# Patient Record
Sex: Male | Born: 1995
Health system: Southern US, Community
[De-identification: ages and names within clinical notes are randomized; demographics above are authoritative.]

## PROBLEM LIST (undated history)

## (undated) DIAGNOSIS — R51 Headache: Secondary | ICD-10-CM

## (undated) DIAGNOSIS — R519 Headache, unspecified: Secondary | ICD-10-CM

## (undated) HISTORY — PX: CIRCUMCISION: SUR203

## (undated) HISTORY — DX: Headache, unspecified: R51.9

## (undated) HISTORY — DX: Headache: R51

## (undated) HISTORY — PX: MYRINGOTOMY WITH TUBE PLACEMENT: SHX5663

---
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
## 2018-05-09 ENCOUNTER — Encounter: Payer: Self-pay | Admitting: Family Medicine

## 2018-05-09 ENCOUNTER — Ambulatory Visit (INDEPENDENT_AMBULATORY_CARE_PROVIDER_SITE_OTHER): Payer: No Typology Code available for payment source | Admitting: Family Medicine

## 2018-05-09 ENCOUNTER — Other Ambulatory Visit: Payer: Self-pay

## 2018-05-09 VITALS — BP 115/62 | HR 68 | Temp 98.4°F | Ht 69.6 in | Wt 211.3 lb

## 2018-05-09 DIAGNOSIS — Z7689 Persons encountering health services in other specified circumstances: Secondary | ICD-10-CM

## 2018-05-09 DIAGNOSIS — G43009 Migraine without aura, not intractable, without status migrainosus: Secondary | ICD-10-CM

## 2018-05-09 DIAGNOSIS — R062 Wheezing: Secondary | ICD-10-CM

## 2018-05-09 MED ORDER — ALBUTEROL SULFATE HFA 108 (90 BASE) MCG/ACT IN AERS: 2.0000 | INHALATION_SPRAY | Freq: Four times a day (QID) | RESPIRATORY_TRACT | 0 refills | Status: AC | PRN

## 2018-05-09 MED ORDER — SUMATRIPTAN SUCCINATE 50 MG PO TABS: 50.0000 mg | ORAL_TABLET | Freq: Once | ORAL | 4 refills | Status: DC

## 2018-05-09 MED ORDER — PROPRANOLOL HCL 10 MG PO TABS: 10.0000 mg | ORAL_TABLET | Freq: Two times a day (BID) | ORAL | 1 refills | Status: DC

## 2018-05-09 NOTE — Progress Notes (Signed)
BP 115/62   Pulse 68   Temp 98.4 F (36.9 C) (Oral)   Ht 5' 9.6" (1.768 m)   Wt 211 lb 4.8 oz (95.8 kg)   SpO2 99%   BMI 30.67 kg/m    Subjective:    Patient ID: Austin Miles, male    DOB: 07-04-1996, 22 y.o.   MRN: [id number]  HPI: Austin Miles is a 22 y.o. male  Chief Complaint  Patient presents with  . New Patient (Initial Visit)    pt would like to disduss about headaches   Here today to establish care. Hx of migraines, now having headaches every day. Used to be on propranolol which helped quite a bit. Currently taking OTC pain relievers with minimal relief. Photophobia, N/V sometimes with more severe ones. Denies confusion, extremity numbness or weakness, visual changes.   Also has an albuterol inhaler on hand for prn chest tightness, usually occurring with exertion.   No other concerns today. Has not had a CPE in several years.   Relevant past medical, surgical, family and social history reviewed and updated as indicated. Interim medical history since our last visit reviewed. Allergies and medications reviewed and updated.  Review of Systems  Per HPI unless specifically indicated above     Objective:    BP 115/62   Pulse 68   Temp 98.4 F (36.9 C) (Oral)   Ht 5' 9.6" (1.768 m)   Wt 211 lb 4.8 oz (95.8 kg)   SpO2 99%   BMI 30.67 kg/m   Wt Readings from Last 3 Encounters:  05/09/18 211 lb 4.8 oz (95.8 kg) (95 %, Z= 1.67)*  06/25/14 209 lb 6.4 oz (95 kg) (99 %, Z= 2.19)*  06/24/14 208 lb 12.8 oz (94.7 kg) (99 %, Z= 2.17)*   * Growth percentiles are based on CDC (Boys, 2-20 Years) data.    Physical Exam  Constitutional: He is oriented to person, place, and time. He appears well-developed and well-nourished. No distress.  HENT:  Head: Atraumatic.  Eyes: Conjunctivae and EOM are normal.  Neck: Normal range of motion. Neck supple.  Cardiovascular: Normal rate and regular rhythm.  Pulmonary/Chest: Effort normal and breath sounds normal.    Musculoskeletal: Normal range of motion.  Neurological: He is alert and oriented to person, place, and time. No cranial nerve deficit or sensory deficit.  Skin: Skin is warm and dry.  Psychiatric: He has a normal mood and affect. His behavior is normal.  Nursing note and vitals reviewed.   Results for orders placed or performed during the hospital encounter of 06/24/14  CBC with Differential  Result Value Ref Range   WBC 8.6 4.5 - 13.5 K/uL   RBC 5.12 3.80 - 5.20 MIL/uL   Hemoglobin 15.9 (H) 11.0 - 14.6 g/dL   HCT 08.6 (H) 57.8 - 46.9 %   MCV 87.9 77.0 - 95.0 fL   MCH 31.1 25.0 - 33.0 pg   MCHC 35.3 31.0 - 37.0 g/dL   RDW 62.9 52.8 - 41.3 %   Platelets 173 150 - 400 K/uL   Neutrophils Relative % 69 (H) 33 - 67 %   Neutro Abs 6.0 1.5 - 8.0 K/uL   Lymphocytes Relative 23 (L) 31 - 63 %   Lymphs Abs 2.0 1.5 - 7.5 K/uL   Monocytes Relative 7 3 - 11 %   Monocytes Absolute 0.6 0.2 - 1.2 K/uL   Eosinophils Relative 1 0 - 5 %   Eosinophils Absolute 0.1 0.0 -  1.2 K/uL   Basophils Relative 0 0 - 1 %   Basophils Absolute 0.0 0.0 - 0.1 K/uL  Comprehensive metabolic panel  Result Value Ref Range   Sodium 138 137 - 147 mEq/L   Potassium 3.8 3.7 - 5.3 mEq/L   Chloride 99 96 - 112 mEq/L   CO2 25 19 - 32 mEq/L   Glucose, Bld 127 (H) 70 - 99 mg/dL   BUN 12 6 - 23 mg/dL   Creatinine, Ser 5.11 (H) 0.50 - 1.00 mg/dL   Calcium 9.6 8.4 - 02.1 mg/dL   Total Protein 7.4 6.0 - 8.3 g/dL   Albumin 4.2 3.5 - 5.2 g/dL   AST 16 0 - 37 U/L   ALT 16 0 - 53 U/L   Alkaline Phosphatase 142 74 - 390 U/L   Total Bilirubin 0.4 0.3 - 1.2 mg/dL   GFR calc non Af Amer NOT CALCULATED >90 mL/min   GFR calc Af Amer NOT CALCULATED >90 mL/min   Anion gap 14 5 - 15  Lipase, blood  Result Value Ref Range   Lipase 19 11 - 59 U/L  Amylase  Result Value Ref Range   Amylase 55 0 - 105 U/L  Urinalysis, Routine w reflex microscopic  Result Value Ref Range   Color, Urine YELLOW YELLOW   APPearance CLEAR CLEAR    Specific Gravity, Urine 1.030 1.005 - 1.030   pH 7.0 5.0 - 8.0   Glucose, UA NEGATIVE NEGATIVE mg/dL   Hgb urine dipstick NEGATIVE NEGATIVE   Bilirubin Urine NEGATIVE NEGATIVE   Ketones, ur NEGATIVE NEGATIVE mg/dL   Protein, ur NEGATIVE NEGATIVE mg/dL   Urobilinogen, UA 1.0 0.0 - 1.0 mg/dL   Nitrite NEGATIVE NEGATIVE   Leukocytes, UA NEGATIVE NEGATIVE      Assessment & Plan:   Problem List Items Addressed This Visit      Cardiovascular and Mediastinum   Migraine without aura    Restart propranolol, watch BPs and HR. Imitrex for prn use. Continue OTC pain relievers prn      Relevant Medications   propranolol (INDERAL) 10 MG tablet    Other Visit Diagnoses    Encounter to establish care    -  Primary   Wheezing       Occasional, usually with illness or exercise. No formal dx of asthma. Will do spirometry if becoming more frequent than 1 inhaler per year       Follow up plan: Return in about 4 weeks (around 06/06/2018) for CPE.

## 2018-05-20 NOTE — Patient Instructions (Signed)
Follow up for CPE 

## 2018-05-20 NOTE — Assessment & Plan Note (Signed)
Restart propranolol, watch BPs and HR. Imitrex for prn use. Continue OTC pain relievers prn

## 2018-06-08 ENCOUNTER — Encounter: Payer: Self-pay | Admitting: Family Medicine

## 2018-06-08 ENCOUNTER — Ambulatory Visit: Payer: No Typology Code available for payment source | Admitting: Family Medicine

## 2018-06-08 VITALS — BP 134/72 | HR 93 | Temp 97.9°F | Ht 69.2 in | Wt 217.9 lb

## 2018-06-08 DIAGNOSIS — G43009 Migraine without aura, not intractable, without status migrainosus: Secondary | ICD-10-CM

## 2018-06-08 DIAGNOSIS — Z0000 Encounter for general adult medical examination without abnormal findings: Secondary | ICD-10-CM | POA: Diagnosis not present

## 2018-06-08 LAB — UA/M W/RFLX CULTURE, ROUTINE
Bilirubin, UA: NEGATIVE
Glucose, UA: NEGATIVE
Ketones, UA: NEGATIVE
Leukocytes, UA: NEGATIVE
NITRITE UA: NEGATIVE
Protein, UA: NEGATIVE
RBC, UA: NEGATIVE
Specific Gravity, UA: 1.02 (ref 1.005–1.030)
Urobilinogen, Ur: 0.2 mg/dL (ref 0.2–1.0)
pH, UA: 6 (ref 5.0–7.5)

## 2018-06-08 MED ORDER — PROPRANOLOL HCL ER 60 MG PO CP24: 60.0000 mg | ORAL_CAPSULE | Freq: Every day | ORAL | 1 refills | Status: DC

## 2018-06-08 NOTE — Progress Notes (Signed)
BP 134/72   Pulse 93   Temp 97.9 F (36.6 C) (Oral)   Ht 5' 9.2" (1.758 m)   Wt 217 lb 14.4 oz (98.8 kg)   SpO2 98%   BMI 31.99 kg/m    Subjective:    Patient ID: Austin Miles, male    DOB: 1996-04-11, 22 y.o.   MRN: [id number]  HPI: [name] is a 22 y.o. male presenting on 06/08/2018 for comprehensive medical examination. Current medical complaints include:see below  Headaches significantly improved with re addition of propranolol. Does not take very consistently because of the twice daily dosing, typically forgets night dose. Only reports 1-2 migraines the past month for which imitrex worked well. Denies nausea, vomiting, visual changes, dizziness.   Depression Screen done today and results listed below:  Depression screen [facility] 2/9 06/08/2018 05/09/2018  Decreased Interest 0 0  Down, Depressed, Hopeless 0 0  PHQ - 2 Score 0 0  Altered sleeping 0 0  Tired, decreased energy 0 0  Change in appetite 0 0  Feeling bad or failure about yourself  0 0  Trouble concentrating 0 0  Moving slowly or fidgety/restless 0 0  Suicidal thoughts 0 0  PHQ-9 Score 0 0  Difficult doing work/chores - Not difficult at all    The patient does not have a history of falls. I did not complete a risk assessment for falls. A plan of care for falls was not documented.   Past Medical History:  Past Medical History:  Diagnosis Date  . Headache     Surgical History:  Past Surgical History:  Procedure Laterality Date  . CIRCUMCISION  1999  . MYRINGOTOMY WITH TUBE PLACEMENT Bilateral    39 Months of age    Medications:  Current Outpatient Medications on File Prior to Visit  Medication Sig  . albuterol (PROVENTIL HFA;VENTOLIN HFA) 108 (90 Base) MCG/ACT inhaler Inhale 2 puffs into the lungs every 6 (six) hours as needed for wheezing or shortness of breath.  . SUMAtriptan (IMITREX) 50 MG tablet TAKE 1 TABLET BY MOUTH ONCE FOR 1 DOSE. MAY REPEAT IN 2 HOURS IF HEADACHE PERSISTS OR  RECURS.   No current facility-administered medications on file prior to visit.     Allergies:  No Known Allergies  Social History:  Social History   Socioeconomic History  . Marital status: Single    Spouse name: Not on file  . Number of children: Not on file  . Years of education: Not on file  . Highest education level: Not on file  Occupational History  . Not on file  Social Needs  . Financial resource strain: Not on file  . Food insecurity:    Worry: Not on file    Inability: Not on file  . Transportation needs:    Medical: Not on file    Non-medical: Not on file  Tobacco Use  . Smoking status: Never Smoker  . Smokeless tobacco: Former Engineer, water and Sexual Activity  . Alcohol use: Yes    Comment: just some times per pt  . Drug use: No  . Sexual activity: Yes  Lifestyle  . Physical activity:    Days per week: Not on file    Minutes per session: Not on file  . Stress: Not on file  Relationships  . Social connections:    Talks on phone: Not on file    Gets together: Not on file    Attends religious service: Not on file  Active member of club or organization: Not on file    Attends meetings of clubs or organizations: Not on file    Relationship status: Not on file  . Intimate partner violence:    Fear of current or ex partner: Not on file    Emotionally abused: Not on file    Physically abused: Not on file    Forced sexual activity: Not on file  Other Topics Concern  . Not on file  Social History Narrative  . Not on file   Social History   Tobacco Use  Smoking Status Never Smoker  Smokeless Tobacco Former Neurosurgeon   Social History   Substance and Sexual Activity  Alcohol Use Yes   Comment: just some times per pt    Family History:  Family History  Problem Relation Age of Onset  . Multiple sclerosis Mother     Past medical history, surgical history, medications, allergies, family history and social history reviewed with patient today and  changes made to appropriate areas of the chart.   Review of Systems - General ROS: negative Psychological ROS: negative Ophthalmic ROS: negative ENT ROS: negative Allergy and Immunology ROS: negative Hematological and Lymphatic ROS: negative Endocrine ROS: negative Respiratory ROS: no cough, shortness of breath, or wheezing Cardiovascular ROS: no chest pain or dyspnea on exertion Gastrointestinal ROS: no abdominal pain, change in bowel habits, or black or bloody stools Genito-Urinary ROS: no dysuria, trouble voiding, or hematuria Musculoskeletal ROS: negative Neurological ROS: no TIA or stroke symptoms Dermatological ROS: negative All other ROS negative except what is listed above and in the HPI.      Objective:    BP 134/72   Pulse 93   Temp 97.9 F (36.6 C) (Oral)   Ht 5' 9.2" (1.758 m)   Wt 217 lb 14.4 oz (98.8 kg)   SpO2 98%   BMI 31.99 kg/m   Wt Readings from Last 3 Encounters:  06/08/18 217 lb 14.4 oz (98.8 kg) (96 %, Z= 1.81)*  05/09/18 211 lb 4.8 oz (95.8 kg) (95 %, Z= 1.67)*  06/25/14 209 lb 6.4 oz (95 kg) (99 %, Z= 2.19)*   * Growth percentiles are based on CDC (Boys, 2-20 Years) data.    Physical Exam  Constitutional: He is oriented to person, place, and time. He appears well-developed and well-nourished. No distress.  HENT:  Head: Atraumatic.  Right Ear: External ear normal.  Left Ear: External ear normal.  Nose: Nose normal.  Mouth/Throat: Oropharynx is clear and moist.  Eyes: Pupils are equal, round, and reactive to light. Conjunctivae and EOM are normal. No scleral icterus.  Neck: Normal range of motion. Neck supple.  Cardiovascular: Normal rate, regular rhythm, normal heart sounds and intact distal pulses.  No murmur heard. Pulmonary/Chest: Effort normal and breath sounds normal. No respiratory distress.  Abdominal: Soft. Bowel sounds are normal. He exhibits no distension and no mass. There is no tenderness. There is no guarding.  Musculoskeletal:  Normal range of motion. He exhibits no edema or tenderness.  Neurological: He is alert and oriented to person, place, and time. He has normal reflexes. No cranial nerve deficit.  Skin: Skin is warm and dry. No rash noted.  Psychiatric: He has a normal mood and affect. His behavior is normal.  Nursing note and vitals reviewed.   Results for orders placed or performed in visit on 06/08/18  CBC with Differential/Platelet  Result Value Ref Range   WBC 5.7 3.4 - 10.8 x10E3/uL   RBC 5.16  4.14 - 5.80 x10E6/uL   Hemoglobin 15.5 13.0 - 17.7 g/dL   Hematocrit 16.1 09.6 - 51.0 %   MCV 87 79 - 97 fL   MCH 30.0 26.6 - 33.0 pg   MCHC 34.4 31.5 - 35.7 g/dL   RDW 04.5 40.9 - 81.1 %   Platelets 185 150 - 450 x10E3/uL   Neutrophils 55 Not Estab. %   Lymphs 34 Not Estab. %   Monocytes 8 Not Estab. %   Eos 3 Not Estab. %   Basos 0 Not Estab. %   Neutrophils Absolute 3.1 1.4 - 7.0 x10E3/uL   Lymphocytes Absolute 1.9 0.7 - 3.1 x10E3/uL   Monocytes Absolute 0.5 0.1 - 0.9 x10E3/uL   EOS (ABSOLUTE) 0.2 0.0 - 0.4 x10E3/uL   Basophils Absolute 0.0 0.0 - 0.2 x10E3/uL   Immature Granulocytes 0 Not Estab. %   Immature Grans (Abs) 0.0 0.0 - 0.1 x10E3/uL  Comprehensive metabolic panel  Result Value Ref Range   Glucose 59 (L) 65 - 99 mg/dL   BUN 20 6 - 20 mg/dL   Creatinine, Ser 9.14 0.76 - 1.27 mg/dL   GFR calc non Af Amer 105 >59 mL/min/1.73   GFR calc Af Amer 121 >59 mL/min/1.73   BUN/Creatinine Ratio 19 9 - 20   Sodium 141 134 - 144 mmol/L   Potassium 3.8 3.5 - 5.2 mmol/L   Chloride 98 96 - 106 mmol/L   CO2 21 20 - 29 mmol/L   Calcium 9.5 8.7 - 10.2 mg/dL   Total Protein 7.0 6.0 - 8.5 g/dL   Albumin 4.8 3.5 - 5.5 g/dL   Globulin, Total 2.2 1.5 - 4.5 g/dL   Albumin/Globulin Ratio 2.2 1.2 - 2.2   Bilirubin Total 0.4 0.0 - 1.2 mg/dL   Alkaline Phosphatase 79 39 - 117 IU/L   AST 17 0 - 40 IU/L   ALT 27 0 - 44 IU/L  Lipid Panel w/o Chol/HDL Ratio  Result Value Ref Range   Cholesterol, Total 162  100 - 169 mg/dL   Triglycerides 782 (H) 0 - 89 mg/dL   HDL 39 (L) >95 mg/dL   VLDL Cholesterol Cal 56 (H) 5 - 40 mg/dL   LDL Calculated 67 0 - 109 mg/dL  UA/M w/rflx Culture, Routine  Result Value Ref Range   Specific Gravity, UA 1.020 1.005 - 1.030   pH, UA 6.0 5.0 - 7.5   Color, UA Yellow Yellow   Appearance Ur Clear Clear   Leukocytes, UA Negative Negative   Protein, UA Negative Negative/Trace   Glucose, UA Negative Negative   Ketones, UA Negative Negative   RBC, UA Negative Negative   Bilirubin, UA Negative Negative   Urobilinogen, Ur 0.2 0.2 - 1.0 mg/dL   Nitrite, UA Negative Negative      Assessment & Plan:   Problem List Items Addressed This Visit      Cardiovascular and Mediastinum   Migraine without aura - Primary    Improved significantly with propranolol added back in. Will switch to ER capsule to improve compliance. Imitrex prn for treatment      Relevant Medications   SUMAtriptan (IMITREX) 50 MG tablet   propranolol ER (INDERAL LA) 60 MG 24 hr capsule    Other Visit Diagnoses    Annual physical exam       Relevant Orders   CBC with Differential/Platelet (Completed)   Comprehensive metabolic panel (Completed)   Lipid Panel w/o Chol/HDL Ratio (Completed)   UA/M w/rflx Culture, Routine (Completed)  Discussed aspirin prophylaxis for myocardial infarction prevention and decision was it was not indicated  LABORATORY TESTING:  Health maintenance labs ordered today as discussed above.   IMMUNIZATIONS:   - Tdap: Tetanus vaccination status reviewed: last tetanus booster within 10 years. - Influenza: Refused  PATIENT COUNSELING:    Sexuality: Discussed sexually transmitted diseases, partner selection, use of condoms, avoidance of unintended pregnancy  and contraceptive alternatives.   Advised to avoid cigarette smoking.  I discussed with the patient that most people either abstain from alcohol or drink within safe limits (<=14/week and <=4  drinks/occasion for males, <=7/weeks and <= 3 drinks/occasion for females) and that the risk for alcohol disorders and other health effects rises proportionally with the number of drinks per week and how often a drinker exceeds daily limits.  Discussed cessation/primary prevention of drug use and availability of treatment for abuse.   Diet: Encouraged to adjust caloric intake to maintain  or achieve ideal body weight, to reduce intake of dietary saturated fat and total fat, to limit sodium intake by avoiding high sodium foods and not adding table salt, and to maintain adequate dietary potassium and calcium preferably from fresh fruits, vegetables, and low-fat dairy products.    stressed the importance of regular exercise  Injury prevention: Discussed safety belts, safety helmets, smoke detector, smoking near bedding or upholstery.   Dental health: Discussed importance of regular tooth brushing, flossing, and dental visits.   Follow up plan: NEXT PREVENTATIVE PHYSICAL DUE IN 1 YEAR. Return in about 6 months (around 12/08/2018) for migraine.

## 2018-06-09 LAB — LIPID PANEL W/O CHOL/HDL RATIO
Cholesterol, Total: 162 mg/dL (ref 100–169)
HDL: 39 mg/dL — ABNORMAL LOW (ref >39)
LDL CALC: 67 mg/dL (ref 0–109)
Triglycerides: 279 mg/dL — ABNORMAL HIGH (ref 0–89)
VLDL Cholesterol Cal: 56 mg/dL — ABNORMAL HIGH (ref 5–40)

## 2018-06-09 LAB — COMPREHENSIVE METABOLIC PANEL
ALBUMIN: 4.8 g/dL (ref 3.5–5.5)
ALT: 27 IU/L (ref 0–44)
AST: 17 IU/L (ref 0–40)
Albumin/Globulin Ratio: 2.2 (ref 1.2–2.2)
Alkaline Phosphatase: 79 IU/L (ref 39–117)
BUN / CREAT RATIO: 19 (ref 9–20)
BUN: 20 mg/dL (ref 6–20)
Bilirubin Total: 0.4 mg/dL (ref 0.0–1.2)
CO2: 21 mmol/L (ref 20–29)
CREATININE: 1.03 mg/dL (ref 0.76–1.27)
Calcium: 9.5 mg/dL (ref 8.7–10.2)
Chloride: 98 mmol/L (ref 96–106)
GFR calc Af Amer: 121 mL/min/{1.73_m2} (ref >59)
GFR calc non Af Amer: 105 mL/min/{1.73_m2} (ref >59)
Globulin, Total: 2.2 g/dL (ref 1.5–4.5)
Glucose: 59 mg/dL — ABNORMAL LOW (ref 65–99)
Potassium: 3.8 mmol/L (ref 3.5–5.2)
Sodium: 141 mmol/L (ref 134–144)
Total Protein: 7 g/dL (ref 6.0–8.5)

## 2018-06-09 LAB — CBC WITH DIFFERENTIAL/PLATELET
BASOS ABS: 0 10*3/uL (ref 0.0–0.2)
Basos: 0 %
EOS (ABSOLUTE): 0.2 10*3/uL (ref 0.0–0.4)
EOS: 3 %
HEMATOCRIT: 45 % (ref 37.5–51.0)
HEMOGLOBIN: 15.5 g/dL (ref 13.0–17.7)
Immature Grans (Abs): 0 10*3/uL (ref 0.0–0.1)
Immature Granulocytes: 0 %
LYMPHS ABS: 1.9 10*3/uL (ref 0.7–3.1)
Lymphs: 34 %
MCH: 30 pg (ref 26.6–33.0)
MCHC: 34.4 g/dL (ref 31.5–35.7)
MCV: 87 fL (ref 79–97)
Monocytes Absolute: 0.5 10*3/uL (ref 0.1–0.9)
Monocytes: 8 %
NEUTROS ABS: 3.1 10*3/uL (ref 1.4–7.0)
Neutrophils: 55 %
Platelets: 185 10*3/uL (ref 150–450)
RBC: 5.16 x10E6/uL (ref 4.14–5.80)
RDW: 12.6 % (ref 12.3–15.4)
WBC: 5.7 10*3/uL (ref 3.4–10.8)

## 2018-06-11 ENCOUNTER — Encounter: Payer: Self-pay | Admitting: Family Medicine

## 2018-06-11 NOTE — Patient Instructions (Signed)
Follow up in 6 months 

## 2018-06-11 NOTE — Assessment & Plan Note (Signed)
Improved significantly with propranolol added back in. Will switch to ER capsule to improve compliance. Imitrex prn for treatment

## 2018-11-09 ENCOUNTER — Encounter: Payer: Self-pay | Admitting: Nurse Practitioner

## 2018-11-09 ENCOUNTER — Ambulatory Visit (INDEPENDENT_AMBULATORY_CARE_PROVIDER_SITE_OTHER): Payer: 59 | Admitting: Nurse Practitioner

## 2018-11-09 ENCOUNTER — Other Ambulatory Visit: Payer: Self-pay

## 2018-11-09 VITALS — BP 128/73 | HR 83 | Temp 98.0°F | Ht 69.2 in | Wt 225.0 lb

## 2018-11-09 DIAGNOSIS — J069 Acute upper respiratory infection, unspecified: Secondary | ICD-10-CM

## 2018-11-09 DIAGNOSIS — R6889 Other general symptoms and signs: Secondary | ICD-10-CM | POA: Diagnosis not present

## 2018-11-09 LAB — VERITOR FLU A/B WAIVED
Influenza A: NEGATIVE
Influenza B: NEGATIVE

## 2018-11-09 NOTE — Assessment & Plan Note (Signed)
Acute with negative flu testing.  Recommend simple treatment at home with following: - Increased rest - Increasing Fluids - Acetaminophen / ibuprofen as needed for fever/pain.  - Salt water gargling, chloraseptic spray and throat lozenges - OTC pseudoephedrine.  - Mucinex.  - Saline sinus flushes or a neti pot.  - Humidifying the air.

## 2018-11-09 NOTE — Patient Instructions (Signed)
Your symptoms and exam findings are most consistent with a viral upper respiratory infection. These usually run their course in 5-7 days. Unfortunately, antibiotics don't work against viruses and just increase your risk of other issues such as diarrhea, yeast infections, and resistant infections.  If you start feeling worse with facial pain, high fever, cough, shortness of breath or start feeling significantly worse, please call us right away to be further evaluated.  Some things that can make you feel better are: - Increased rest - Increasing Fluids - Acetaminophen / ibuprofen as needed for fever/pain.  - Salt water gargling, chloraseptic spray and throat lozenges - OTC pseudoephedrine.  - Mucinex.  - Saline sinus flushes or a neti pot.  - Humidifying the air.   Upper Respiratory Infection, Adult An upper respiratory infection (URI) affects the nose, throat, and upper air passages. URIs are caused by germs (viruses). The most common type of URI is often called "the common cold." Medicines cannot cure URIs, but you can do things at home to relieve your symptoms. URIs usually get better within 7-10 days. Follow these instructions at home: Activity  Rest as needed.  If you have a fever, stay home from work or school until your fever is gone, or until your doctor says you may return to work or school. ? You should stay home until you cannot spread the infection anymore (you are not contagious). ? Your doctor may have you wear a face mask so you have less risk of spreading the infection. Relieving symptoms  Gargle with a salt-water mixture 3-4 times a day or as needed. To make a salt-water mixture, completely dissolve -1 tsp of salt in 1 cup of warm water.  Use a cool-mist humidifier to add moisture to the air. This can help you breathe more easily. Eating and drinking   Drink enough fluid to keep your pee (urine) pale yellow.  Eat soups and other clear broths. General  instructions   Take over-the-counter and prescription medicines only as told by your doctor. These include cold medicines, fever reducers, and cough suppressants.  Do not use any products that contain nicotine or tobacco. These include cigarettes and e-cigarettes. If you need help quitting, ask your doctor.  Avoid being where people are smoking (avoid secondhand smoke).  Make sure you get regular shots and get the flu shot every year.  Keep all follow-up visits as told by your doctor. This is important. How to avoid spreading infection to others   Wash your hands often with soap and water. If you do not have soap and water, use hand sanitizer.  Avoid touching your mouth, face, eyes, or nose.  Cough or sneeze into a tissue or your sleeve or elbow. Do not cough or sneeze into your hand or into the air. Contact a doctor if:  You are getting worse, not better.  You have any of these: ? A fever. ? Chills. ? Brown or red mucus in your nose. ? Yellow or brown fluid (discharge)coming from your nose. ? Pain in your face, especially when you bend forward. ? Swollen neck glands. ? Pain with swallowing. ? White areas in the back of your throat. Get help right away if:  You have shortness of breath that gets worse.  You have very bad or constant: ? Headache. ? Ear pain. ? Pain in your forehead, behind your eyes, and over your cheekbones (sinus pain). ? Chest pain.  You have long-lasting (chronic) lung disease along with any of these: ?   Wheezing. ? Long-lasting cough. ? Coughing up blood. ? A change in your usual mucus.  You have a stiff neck.  You have changes in your: ? Vision. ? Hearing. ? Thinking. ? Mood. Summary  An upper respiratory infection (URI) is caused by a germ called a virus. The most common type of URI is often called "the common cold."  URIs usually get better within 7-10 days.  Take over-the-counter and prescription medicines only as told by your  doctor. This information is not intended to replace advice given to you by your health care provider. Make sure you discuss any questions you have with your health care provider. Document Released: 02/01/2008 Document Revised: 04/07/2017 Document Reviewed: 04/07/2017 Elsevier Interactive Patient Education  2019 [employer]  

## 2018-11-09 NOTE — Progress Notes (Signed)
BP 128/73   Pulse 83 Comment: apical  Temp 98 F (36.7 C) (Oral)   Ht 5' 9.2" (1.758 m)   Wt 225 lb (102.1 kg)   SpO2 97%   BMI 33.03 kg/m    Subjective:    Patient ID: Austin Miles, male    DOB: 06-Mar-1996, 22 y.o.   MRN: [id number]  HPI: Austin Miles is a 24 y.o. male  Chief Complaint  Patient presents with  . Sore Throat  . Headache    Sinus like pressure   UPPER RESPIRATORY TRACT INFECTION Started earlier yesterday.  No exposure to flu, reports his brother was tested but it was negative.   Worst symptom: Fever: no Cough: yes Shortness of breath: no Wheezing: no Chest pain: no Chest tightness: no Chest congestion: no Nasal congestion: yes Runny nose: no Post nasal drip: no Sneezing: yes Sore throat: yes, more yesterday than today Swollen glands: no Sinus pressure: no Headache: no Face pain: no Toothache: no Ear pain: none Ear pressure: none Eyes red/itching:no Eye drainage/crusting: no  Vomiting: no Rash: no Fatigue: yes Sick contacts: yes Strep contacts: no  Context: stable Recurrent sinusitis: no Relief with OTC cold/cough medications: has not taken any  Treatments attempted: none   Relevant past medical, surgical, family and social history reviewed and updated as indicated. Interim medical history since our last visit reviewed. Allergies and medications reviewed and updated.  Review of Systems  Per HPI unless specifically indicated above     Objective:    BP 128/73   Pulse 83 Comment: apical  Temp 98 F (36.7 C) (Oral)   Ht 5' 9.2" (1.758 m)   Wt 225 lb (102.1 kg)   SpO2 97%   BMI 33.03 kg/m   Wt Readings from Last 3 Encounters:  11/09/18 225 lb (102.1 kg)  06/08/18 217 lb 14.4 oz (98.8 kg) (96 %, Z= 1.81)*  05/09/18 211 lb 4.8 oz (95.8 kg) (95 %, Z= 1.67)*   * Growth percentiles are based on CDC (Boys, 2-20 Years) data.    Physical Exam Vitals signs and nursing note reviewed.  Constitutional:      General:  He is awake.     Appearance: He is well-developed.  HENT:     Head: Normocephalic and atraumatic.     Right Ear: Hearing, ear canal and external ear normal. No drainage. A middle ear effusion is present.     Left Ear: Hearing, ear canal and external ear normal. No drainage. A middle ear effusion is present.     Nose: Mucosal edema and rhinorrhea present. Rhinorrhea is clear.     Right Sinus: No maxillary sinus tenderness or frontal sinus tenderness.     Left Sinus: No maxillary sinus tenderness or frontal sinus tenderness.     Mouth/Throat:     Mouth: Mucous membranes are moist.     Pharynx: Uvula midline. Posterior oropharyngeal erythema (mild with cobblestoning) present. No pharyngeal swelling or oropharyngeal exudate.  Eyes:     General: Lids are normal.        Right eye: No discharge.        Left eye: No discharge.     Conjunctiva/sclera: Conjunctivae normal.     Pupils: Pupils are equal, round, and reactive to light.  Neck:     Musculoskeletal: Normal range of motion and neck supple.     Thyroid: No thyromegaly.     Vascular: No carotid bruit or JVD.     Trachea: Trachea normal.  Cardiovascular:     Rate and Rhythm: Normal rate and regular rhythm.     Heart sounds: Normal heart sounds, S1 normal and S2 normal. No murmur. No gallop.   Pulmonary:     Effort: Pulmonary effort is normal.     Breath sounds: Normal breath sounds.  Abdominal:     General: Bowel sounds are normal.     Palpations: Abdomen is soft. There is no hepatomegaly or splenomegaly.  Musculoskeletal: Normal range of motion.     Right lower leg: No edema.     Left lower leg: No edema.  Lymphadenopathy:     Head:     Right side of head: No submental, submandibular, tonsillar, preauricular or posterior auricular adenopathy.     Left side of head: No submental, submandibular, tonsillar, preauricular or posterior auricular adenopathy.  Skin:    General: Skin is warm and dry.     Capillary Refill: Capillary  refill takes less than 2 seconds.     Findings: No rash.  Neurological:     Mental Status: He is alert and oriented to person, place, and time.     Deep Tendon Reflexes: Reflexes are normal and symmetric.  Psychiatric:        Mood and Affect: Mood normal.        Behavior: Behavior normal. Behavior is cooperative.        Thought Content: Thought content normal.        Judgment: Judgment normal.     Results for orders placed or performed in visit on 06/08/18  CBC with Differential/Platelet  Result Value Ref Range   WBC 5.7 3.4 - 10.8 x10E3/uL   RBC 5.16 4.14 - 5.80 x10E6/uL   Hemoglobin 15.5 13.0 - 17.7 g/dL   Hematocrit 16.1 09.6 - 51.0 %   MCV 87 79 - 97 fL   MCH 30.0 26.6 - 33.0 pg   MCHC 34.4 31.5 - 35.7 g/dL   RDW 04.5 40.9 - 81.1 %   Platelets 185 150 - 450 x10E3/uL   Neutrophils 55 Not Estab. %   Lymphs 34 Not Estab. %   Monocytes 8 Not Estab. %   Eos 3 Not Estab. %   Basos 0 Not Estab. %   Neutrophils Absolute 3.1 1.4 - 7.0 x10E3/uL   Lymphocytes Absolute 1.9 0.7 - 3.1 x10E3/uL   Monocytes Absolute 0.5 0.1 - 0.9 x10E3/uL   EOS (ABSOLUTE) 0.2 0.0 - 0.4 x10E3/uL   Basophils Absolute 0.0 0.0 - 0.2 x10E3/uL   Immature Granulocytes 0 Not Estab. %   Immature Grans (Abs) 0.0 0.0 - 0.1 x10E3/uL  Comprehensive metabolic panel  Result Value Ref Range   Glucose 59 (L) 65 - 99 mg/dL   BUN 20 6 - 20 mg/dL   Creatinine, Ser 9.14 0.76 - 1.27 mg/dL   GFR calc non Af Amer 105 >59 mL/min/1.73   GFR calc Af Amer 121 >59 mL/min/1.73   BUN/Creatinine Ratio 19 9 - 20   Sodium 141 134 - 144 mmol/L   Potassium 3.8 3.5 - 5.2 mmol/L   Chloride 98 96 - 106 mmol/L   CO2 21 20 - 29 mmol/L   Calcium 9.5 8.7 - 10.2 mg/dL   Total Protein 7.0 6.0 - 8.5 g/dL   Albumin 4.8 3.5 - 5.5 g/dL   Globulin, Total 2.2 1.5 - 4.5 g/dL   Albumin/Globulin Ratio 2.2 1.2 - 2.2   Bilirubin Total 0.4 0.0 - 1.2 mg/dL   Alkaline Phosphatase 79 39 - 117 IU/L  AST 17 0 - 40 IU/L   ALT 27 0 - 44 IU/L  Lipid  Panel w/o Chol/HDL Ratio  Result Value Ref Range   Cholesterol, Total 162 100 - 169 mg/dL   Triglycerides 867 (H) 0 - 89 mg/dL   HDL 39 (L) >61 mg/dL   VLDL Cholesterol Cal 56 (H) 5 - 40 mg/dL   LDL Calculated 67 0 - 109 mg/dL  UA/M w/rflx Culture, Routine  Result Value Ref Range   Specific Gravity, UA 1.020 1.005 - 1.030   pH, UA 6.0 5.0 - 7.5   Color, UA Yellow Yellow   Appearance Ur Clear Clear   Leukocytes, UA Negative Negative   Protein, UA Negative Negative/Trace   Glucose, UA Negative Negative   Ketones, UA Negative Negative   RBC, UA Negative Negative   Bilirubin, UA Negative Negative   Urobilinogen, Ur 0.2 0.2 - 1.0 mg/dL   Nitrite, UA Negative Negative      Assessment & Plan:   Problem List Items Addressed This Visit      Respiratory   Viral upper respiratory tract infection - Primary    Acute with negative flu testing.  Recommend simple treatment at home with following: - Increased rest - Increasing Fluids - Acetaminophen / ibuprofen as needed for fever/pain.  - Salt water gargling, chloraseptic spray and throat lozenges - OTC pseudoephedrine.  - Mucinex.  - Saline sinus flushes or a neti pot.  - Humidifying the air.      Relevant Orders   Veritor Flu A/B Waived       Follow up plan: Return if symptoms worsen or fail to improve.

## 2019-01-27 DIAGNOSIS — T1501XA Foreign body in cornea, right eye, initial encounter: Secondary | ICD-10-CM | POA: Diagnosis not present

## 2019-04-24 ENCOUNTER — Ambulatory Visit: Payer: Self-pay | Admitting: Family Medicine

## 2019-04-24 DIAGNOSIS — M7742 Metatarsalgia, left foot: Secondary | ICD-10-CM | POA: Diagnosis not present

## 2019-04-24 NOTE — Telephone Encounter (Signed)
Pt's mother calling, pt present during call. States pain top of left foot, "Bone between 2nd and 3rd toe." Does not recall any injury. States "Very swollen x 2 days, barely able to bear weight." No bruising. Unable to get shoes on. No redness or warmth. Requesting XRay. Mother is transplant pt, "I don't want to go to a busy place and wait around sick people." [place] called practice, spoke with Christan who recommended [facility]. Information provided.Pt and mother grateful for recommendation, will follow disposition. Assured [place] would route to practice for Ms. Lane's review.  Reason for Disposition . [1] SEVERE pain AND [2] not improved 2 hours after pain medicine/ice packs  Answer Assessment - Initial Assessment Questions 1. MECHANISM: "How did the injury happen?" (e.g., twisting injury, direct blow)     Not sure 2. ONSET: "When did the injury happen?" (Minutes or hours ago)     Painful x 1 week 3. LOCATION: "Where is the injury located?"      "Bone on top of foot, between 2-3rd toe" 4. APPEARANCE of INJURY: "What does the injury look like?"      Swollen 5. WEIGHT-BEARING: "Can you put weight on that foot?" "Can you walk (four steps or more)?"       Barely, very painful 6. SIZE: For cuts, bruises, or swelling, ask: "How large is it?" (e.g., inches or centimeters;  entire joint)      none 7. PAIN: "Is there pain?" If so, ask: "How bad is the pain?"    (e.g., Scale 1-10; or mild, moderate, severe)     10/10   10 8. TETANUS: For any breaks in the skin, ask: "When was the last tetanus booster?"    NA 9. OTHER SYMPTOMS: "Do you have any other symptoms?"      no  Protocols used: ANKLE AND FOOT INJURY-A-AH

## 2019-05-10 ENCOUNTER — Other Ambulatory Visit: Payer: Self-pay

## 2019-05-10 DIAGNOSIS — R6889 Other general symptoms and signs: Secondary | ICD-10-CM | POA: Diagnosis not present

## 2019-05-10 DIAGNOSIS — Z20822 Contact with and (suspected) exposure to covid-19: Secondary | ICD-10-CM

## 2019-05-12 LAB — NOVEL CORONAVIRUS, NAA: SARS-CoV-2, NAA: NOT DETECTED

## 2019-06-05 ENCOUNTER — Other Ambulatory Visit: Payer: Self-pay | Admitting: Family Medicine

## 2019-06-05 NOTE — Telephone Encounter (Signed)
Requested medication (s) are due for refill today: yes  Requested medication (s) are on the active medication list: yes  Last refill:  02/08/2019  Future visit scheduled: no   Notes to clinic:  Review for refill   Requested Prescriptions  Pending Prescriptions Disp Refills   SUMAtriptan (IMITREX) 50 MG tablet [Pharmacy Med Name: SUMATRIPTAN SUCC 50 MG TABLET] 9 tablet 3    Sig: TAKE 1 TABLET BY MOUTH ONCE FOR 1 DOSE. MAY REPEAT IN 2 HOURS IF HEADACHE PERSISTS OR RECURS.     Neurology:  Migraine Therapy - Triptan Passed - 06/05/2019  1:17 PM      Passed - Last BP in normal range    BP Readings from Last 1 Encounters:  11/09/18 128/73         Passed - Valid encounter within last 12 months    Recent Outpatient Visits          6 months ago Viral upper respiratory tract infection   [facility] [place], [place], NP   12 months ago Migraine without aura and without status migrainosus, not intractable   [place], [place], [place]   1 year ago Encounter to establish care   [facility], [place], [place]

## 2019-07-22 ENCOUNTER — Other Ambulatory Visit: Payer: Self-pay

## 2019-07-22 DIAGNOSIS — Z20822 Contact with and (suspected) exposure to covid-19: Secondary | ICD-10-CM

## 2019-07-23 DIAGNOSIS — Z20828 Contact with and (suspected) exposure to other viral communicable diseases: Secondary | ICD-10-CM | POA: Diagnosis not present

## 2019-07-24 LAB — NOVEL CORONAVIRUS, NAA: SARS-CoV-2, NAA: DETECTED — AB

## 2019-08-05 ENCOUNTER — Encounter: Payer: Self-pay | Admitting: Family Medicine

## 2019-08-06 ENCOUNTER — Ambulatory Visit (INDEPENDENT_AMBULATORY_CARE_PROVIDER_SITE_OTHER): Payer: 59 | Admitting: Unknown Physician Specialty

## 2019-08-06 ENCOUNTER — Encounter: Payer: Self-pay | Admitting: Unknown Physician Specialty

## 2019-08-06 ENCOUNTER — Other Ambulatory Visit: Payer: Self-pay

## 2019-08-06 DIAGNOSIS — U071 COVID-19: Secondary | ICD-10-CM

## 2019-08-06 NOTE — Progress Notes (Signed)
   There were no vitals taken for this visit.   Subjective:    Patient ID: Austin Miles, male    DOB: [birth year], 23 y.o.   MRN: [id number]  HPI: Austin Miles is a 23 y.o. male  Chief Complaint  Patient presents with  . Letter for School/Work    pt tested positive for COVID 07/23/19, states the health department told him quarantine was up 08/01/19. Going back tomorrow. Needs letter excusing him from 07/22/19 through 08/06/19    Relevant past medical, surgical, family and social history reviewed and updated as indicated. Interim medical history since our last visit reviewed. Allergies and medications reviewed and updated.  Review of Systems  Per HPI unless specifically indicated above     Objective:    There were no vitals taken for this visit.  Wt Readings from Last 3 Encounters:  11/09/18 225 lb (102.1 kg)  06/08/18 217 lb 14.4 oz (98.8 kg) (96 %, Z= 1.81)*  05/09/18 211 lb 4.8 oz (95.8 kg) (95 %, Z= 1.67)*   * Growth percentiles are based on CDC (Boys, 2-20 Years) data.    Physical Exam  Results for orders placed or performed in visit on 07/22/19  Novel Coronavirus, NAA ([facility])   Specimen: Nasopharyngeal(NP) swabs in vial transport medium   NASOPHARYNGE  TESTING  Result Value Ref Range   SARS-CoV-2, NAA Detected (A) Not Detected      Assessment & Plan:   Problem List Items Addressed This Visit    None       Follow up plan: No follow-ups on file.   Letter written

## 2019-08-06 NOTE — Telephone Encounter (Signed)
Called to schedule appointment , Pt stated he would call back to make appointment.

## 2019-09-23 ENCOUNTER — Other Ambulatory Visit: Payer: Self-pay | Admitting: Family Medicine

## 2020-02-07 ENCOUNTER — Other Ambulatory Visit: Payer: Self-pay | Admitting: Family Medicine

## 2020-02-07 NOTE — Telephone Encounter (Signed)
Please route accordingly, not a pt at [facility]

## 2020-02-07 NOTE — Telephone Encounter (Signed)
Requested  medications are  due for refill today yes  Requested medications are on the active medication list yes  Last refill 01/12/20  Future visit scheduled no  Notes to clinic Failed protocol due to no visit within 12 months

## 2020-02-07 NOTE — Telephone Encounter (Signed)
Requested  medications are  due for refill today yes  Requested medications are on the active medication list yes  Last refill 01/12/20  Future visit scheduled no  Last visit over a year ago  Notes to clinic Failed protocol due to no visit within 12 months

## 2020-02-10 NOTE — Telephone Encounter (Signed)
Routing to provider  

## 2020-03-30 NOTE — Progress Notes (Deleted)
   There were no vitals taken for this visit.   Subjective:    Patient ID: Austin Miles, male    DOB: [birth year], 24 y.o.   MRN: [id number]  HPI: Austin Miles is a 24 y.o. male presenting with bug bite.  No chief complaint on file.  WASP STING Duration: Location:  Onset: Itching: {Blank single:19197::"yes","no"} Status: {Blank single:19197::"controlled","uncontrolled","better","worse","exacerbated","stable"} Treatments attempted:  Fever: {Blank single:19197::"yes","no"} Chills: {Blank single:19197::"yes","no"} headache: {Blank single:19197::"yes","no"} Muscle pain: {Blank single:19197::"yes","no"} Rash: {Blank single:19197::"yes","no"}  SKIN LESION Duration: {Blank single:19197::"days","weeks","months"} Location:  Painful: {Blank single:19197::"yes","no"} Itching: {Blank single:19197::"yes","no"} Onset: {Blank single:19197::"sudden","gradual"} Context: {Blank single:19197::"bigger","smaller","changing","not changing"} Associated signs and symptoms:  History of skin cancer: {Blank single:19197::"yes","no"} History of precancerous skin lesions: {Blank single:19197::"yes","no"} Family history of skin cancer: {Blank single:19197::"yes","no"}  No Known Allergies  Outpatient Encounter Medications as of 03/31/2020  Medication Sig  . albuterol (PROVENTIL HFA;VENTOLIN HFA) 108 (90 Base) MCG/ACT inhaler Inhale 2 puffs into the lungs every 6 (six) hours as needed for wheezing or shortness of breath.  . propranolol ER (INDERAL LA) 60 MG 24 hr capsule Take 1 capsule (60 mg total) by mouth daily. (Patient not taking: Reported on 08/06/2019)  . SUMAtriptan (IMITREX) 50 MG tablet TAKE 1 TABLET BY MOUTH ONCE FOR 1 DOSE. MAY REPEAT IN 2 HOURS IF HEADACHE PERSISTS OR RECURS.   No facility-administered encounter medications on file as of 03/31/2020.   Patient Active Problem List   Diagnosis Date Noted  . Viral upper respiratory tract infection 11/09/2018  . Migraine without aura  06/23/2014  . Stress headaches 06/23/2014   Past Medical History:  Diagnosis Date  . Headache     Relevant past medical, surgical, family and social history reviewed and updated as indicated. Interim medical history since our last visit reviewed.  Review of Systems  Per HPI unless specifically indicated above     Objective:    There were no vitals taken for this visit.  Wt Readings from Last 3 Encounters:  11/09/18 225 lb (102.1 kg)  06/08/18 217 lb 14.4 oz (98.8 kg) (96 %, Z= 1.81)*  05/09/18 211 lb 4.8 oz (95.8 kg) (95 %, Z= 1.67)*   * Growth percentiles are based on CDC (Boys, 2-20 Years) data.    Physical Exam  Results for orders placed or performed in visit on 07/22/19  Novel Coronavirus, NAA ([facility])   Specimen: Nasopharyngeal(NP) swabs in vial transport medium   NASOPHARYNGE  TESTING  Result Value Ref Range   SARS-CoV-2, NAA Detected (A) Not Detected      Assessment & Plan:   Problem List Items Addressed This Visit    None       Follow up plan: No follow-ups on file.

## 2020-03-31 ENCOUNTER — Ambulatory Visit: Payer: 59 | Admitting: Nurse Practitioner

## 2020-04-20 ENCOUNTER — Ambulatory Visit: Payer: 59 | Admitting: Nurse Practitioner

## 2020-04-27 ENCOUNTER — Telehealth: Payer: 59 | Admitting: Emergency Medicine

## 2020-04-27 DIAGNOSIS — R197 Diarrhea, unspecified: Secondary | ICD-10-CM

## 2020-04-27 MED ORDER — AZITHROMYCIN 250 MG PO TABS: ORAL_TABLET | ORAL | 0 refills | Status: DC

## 2020-04-27 NOTE — Progress Notes (Signed)
We are sorry that you are not feeling well.  Here is how we plan to help!  Based on what you have shared with me it looks like you have Acute Infectious Diarrhea.  Most cases of acute diarrhea are due to infections with virus and bacteria and are self-limited conditions lasting less than 14 days.  For your symptoms you may take Imodium 2 mg tablets that are over the counter at your local pharmacy. Take two tablet now and then one after each loose stool up to 6 a day.  Antibiotics are not needed for most people with diarrhea.   Optional: I have prescribed azithromycin 500 mg daily for 3 days  HOME CARE  We recommend changing your diet to help with your symptoms for the next few days.  Drink plenty of fluids that contain water salt and sugar. Sports drinks such as Gatorade may help.   You may try broths, soups, bananas, applesauce, soft breads, mashed potatoes or crackers.   You are considered infectious for as long as the diarrhea continues. Hand washing or use of alcohol based hand sanitizers is recommend.  It is best to stay out of work or school until your symptoms stop.   GET HELP RIGHT AWAY  If you have dark yellow colored urine or do not pass urine frequently you should drink more fluids.    If your symptoms worsen   If you feel like you are going to pass out (faint)  You have a new problem  MAKE SURE YOU   Understand these instructions.  Will watch your condition.  Will get help right away if you are not doing well or get worse.  Your e-visit answers were reviewed by a board certified advanced clinical practitioner to complete your personal care plan.  Depending on the condition, your plan could have included both over the counter or prescription medications.  If there is a problem please reply  once you have received a response from your provider.  Your safety is important to [place].  If you have drug allergies check your prescription carefully.    You can use  MyChart to ask questions about today's visit, request a non-urgent call back, or ask for a work or school excuse for 24 hours related to this e-Visit. If it has been greater than 24 hours you will need to follow up with your provider, or enter a new e-Visit to address those concerns.   You will get an e-mail in the next two days asking about your experience.  I hope that your e-visit has been valuable and will speed your recovery. Thank you for using e-visits.  **Please do not respond to this message unless you have follow up questions.** Greater than 5 but less than 10 minutes spent researching, coordinating, and implementing care for this patient today

## 2020-05-20 ENCOUNTER — Telehealth: Payer: 59 | Admitting: Nurse Practitioner

## 2020-05-20 DIAGNOSIS — R059 Cough, unspecified: Secondary | ICD-10-CM

## 2020-05-20 DIAGNOSIS — Z20822 Contact with and (suspected) exposure to covid-19: Secondary | ICD-10-CM

## 2020-05-20 DIAGNOSIS — R05 Cough: Secondary | ICD-10-CM

## 2020-05-20 MED ORDER — BENZONATATE 100 MG PO CAPS: 100.0000 mg | ORAL_CAPSULE | Freq: Three times a day (TID) | ORAL | 0 refills | Status: DC | PRN

## 2020-05-20 NOTE — Progress Notes (Signed)
E-Visit for Corona Virus Screening  Your current symptoms could be consistent with the coronavirus.  Many health care providers can now test patients at their office but not all are.  [place] has multiple testing sites. For information on our COVID testing locations and hours go to [email or web address] * it has been almost a year since you have covid. There is the new Delta strand out. You need to be tested for covid so we know for sure what we are dealing with. We are enrolling you in our MyChart Home Monitoring for COVID19 . Daily you will receive a questionnaire within the MyChart website. Our COVID 19 response team will be monitoring your responses daily.  Testing Information: The COVID-19 Community Testing sites are testing BY APPOINTMENT ONLY.  You can schedule online at [email or web address]  If you do not have access to a smart phone or computer you may call [phone] for an appointment.   Additional testing sites in the Community:  . For [facility] Testing sites in [facility]  [email or web address]  . For Pop-up testing sites in [place]  [email or web address]  . For [facility] [email or web address]  . For [facility] testing in [place] and [employer] [email or web address]  . For [facility] testing in [facility]   [email or web address]  For  more information about community testing call [phone]   Please quarantine yourself while awaiting your test results. Please stay home for a minimum of 10 days from the first day of illness with improving symptoms and you have had 24 hours of no fever (without the use of Tylenol (Acetaminophen) Motrin  (Ibuprofen) or any fever reducing medication).  Also - Do not get tested prior to returning to work because once you have had a positive test the test can stay positive for more than a month in some cases.   You should wear a mask or cloth face covering over your nose and mouth if you must be around other people or animals, including pets (even at home). Try to stay at least 6 feet away from other people. This will protect the people around you.  Please continue good preventive care measures, including:  frequent hand-washing, avoid touching your face, cover coughs/sneezes, stay out of crowds and keep a 6 foot distance from others.  COVID-19 is a respiratory illness with symptoms that are similar to the flu. Symptoms are typically mild to moderate, but there have been cases of severe illness and death due to the virus.   The following symptoms may appear 2-14 days after exposure: . Fever . Cough . Shortness of breath or difficulty breathing . Chills . Repeated shaking with chills . Muscle pain . Headache . Sore throat . New loss of taste or smell . Fatigue . Congestion or runny nose . Nausea or vomiting . Diarrhea  Go to the nearest hospital ED for assessment if fever/cough/breathlessness are severe or illness seems like a threat to life.  It is vitally important that if you feel that you have an infection such as this virus or any other virus that you stay home and away from places where you may spread it to others.  You should avoid contact with people age 88 and older.   You can use medication such as A prescription cough medication called Tessalon Perles 100 mg. You may take 1-2 capsules every 8 hours as needed for cough  You may also take acetaminophen (Tylenol) as needed for fever.  Reduce your risk of any infection by using  the same precautions used for avoiding the common cold or flu:  Marland Kitchen Wash your hands often with soap and warm water for at least 20 seconds.  If soap and water are  not readily available, use an alcohol-based hand sanitizer with at least 60% alcohol.  . If coughing or sneezing, cover your mouth and nose by coughing or sneezing into the elbow areas of your shirt or coat, into a tissue or into your sleeve (not your hands). . Avoid shaking hands with others and consider head nods or verbal greetings only. . Avoid touching your eyes, nose, or mouth with unwashed hands.  . Avoid close contact with people who are sick. . Avoid places or events with large numbers of people in one location, like concerts or sporting events. . Carefully consider travel plans you have or are making. . If you are planning any travel outside or inside the [place], visit the CDC's Travelers' Health webpage for the latest health notices. . If you have some symptoms but not all symptoms, continue to monitor at home and seek medical attention if your symptoms worsen. . If you are having a medical emergency, call 911.  HOME CARE . Only take medications as instructed by your medical team. . Drink plenty of fluids and get plenty of rest. . A steam or ultrasonic humidifier can help if you have congestion.   GET HELP RIGHT AWAY IF YOU HAVE EMERGENCY WARNING SIGNS** FOR COVID-19. If you or someone is showing any of these signs seek emergency medical care immediately. Call 911 or proceed to your closest emergency facility if: . You develop worsening high fever. . Trouble breathing . Bluish lips or face . Persistent pain or pressure in the chest . New confusion . Inability to wake or stay awake . You cough up blood. . Your symptoms become more severe  **This list is not all possible symptoms. Contact your medical provider for any symptoms that are sever or concerning to you.  MAKE SURE YOU   Understand these instructions.  Will watch your condition.  Will get help right away if you are not doing well or get worse.  Your e-visit answers were reviewed by a board certified advanced clinical  practitioner to complete your personal care plan.  Depending on the condition, your plan could have included both over the counter or prescription medications.  If there is a problem please reply once you have received a response from your provider.  Your safety is important to [place].  If you have drug allergies check your prescription carefully.    You can use MyChart to ask questions about today's visit, request a non-urgent call back, or ask for a work or school excuse for 24 hours related to this e-Visit. If it has been greater than 24 hours you will need to follow up with your provider, or enter a new e-Visit to address those concerns. You will get an e-mail in the next two days asking about your experience.  I hope that your e-visit has been valuable and will speed your recovery. Thank you for using e-visits.   5-10 minutes spent reviewing and documenting in chart.

## 2020-06-08 ENCOUNTER — Encounter: Payer: Self-pay | Admitting: Unknown Physician Specialty

## 2020-06-08 ENCOUNTER — Encounter: Payer: Self-pay | Admitting: Nurse Practitioner

## 2020-06-10 ENCOUNTER — Telehealth: Payer: 59 | Admitting: Family

## 2020-06-10 DIAGNOSIS — J069 Acute upper respiratory infection, unspecified: Secondary | ICD-10-CM | POA: Diagnosis not present

## 2020-06-10 MED ORDER — FLUTICASONE PROPIONATE 50 MCG/ACT NA SUSP: 2.0000 | Freq: Every day | NASAL | 6 refills | Status: DC

## 2020-06-10 NOTE — Progress Notes (Signed)
We are sorry you are not feeling well.  Here is how we plan to help!  Based on what you have shared with me, it looks like you may have a viral upper respiratory infection.  Upper respiratory infections are caused by a large number of viruses; however, rhinovirus is the most common cause.    Given your symptoms, I also recommend you to be COVID tested to rule out.   Symptoms vary from person to person, with common symptoms including sore throat, cough, fatigue or lack of energy and feeling of general discomfort.  A low-grade fever of up to 100.4 may present, but is often uncommon.  Symptoms vary however, and are closely related to a person's age or underlying illnesses.  The most common symptoms associated with an upper respiratory infection are nasal discharge or congestion, cough, sneezing, headache and pressure in the ears and face.  These symptoms usually persist for about 3 to 10 days, but can last up to 2 weeks.  It is important to know that upper respiratory infections do not cause serious illness or complications in most cases.    Upper respiratory infections can be transmitted from person to person, with the most common method of transmission being a person's hands.  The virus is able to live on the skin and can infect other persons for up to 2 hours after direct contact.  Also, these can be transmitted when someone coughs or sneezes; thus, it is important to cover the mouth to reduce this risk.  To keep the spread of the illness at bay, good hand hygiene is very important.  This is an infection that is most likely caused by a virus. There are no specific treatments other than to help you with the symptoms until the infection runs its course.  We are sorry you are not feeling well.  Here is how we plan to help!   For nasal congestion, you may use an oral decongestants such as Mucinex D or if you have glaucoma or high blood pressure use plain Mucinex.  Saline nasal spray or nasal drops can help  and can safely be used as often as needed for congestion.  For your congestion, I have prescribed Fluticasone nasal spray one spray in each nostril twice a day  If you do not have a history of heart disease, hypertension, diabetes or thyroid disease, prostate/bladder issues or glaucoma, you may also use Sudafed to treat nasal congestion.  It is highly recommended that you consult with a pharmacist or your primary care physician to ensure this medication is safe for you to take.     If you have a cough, you may use cough suppressants such as Delsym and Robitussin.  If you have glaucoma or high blood pressure, you can also use Coricidin HBP.     If you have a sore or scratchy throat, use a saltwater gargle-  to  teaspoon of salt dissolved in a 4-ounce to 8-ounce glass of warm water.  Gargle the solution for approximately 15-30 seconds and then spit.  It is important not to swallow the solution.  You can also use throat lozenges/cough drops and Chloraseptic spray to help with throat pain or discomfort.  Warm or cold liquids can also be helpful in relieving throat pain.  For headache, pain or general discomfort, you can use Ibuprofen or Tylenol as directed.   Some authorities believe that zinc sprays or the use of Echinacea may shorten the course of your symptoms.  HOME CARE . Only take medications as instructed by your medical team. . Be sure to drink plenty of fluids. Water is fine as well as fruit juices, sodas and electrolyte beverages. You may want to stay away from caffeine or alcohol. If you are nauseated, try taking small sips of liquids. How do you know if you are getting enough fluid? Your urine should be a pale yellow or almost colorless. . Get rest. . Taking a steamy shower or using a humidifier may help nasal congestion and ease sore throat pain. You can place a towel over your head and breathe in the steam from hot water coming from a faucet. . Using a saline nasal spray works much the  same way. . Cough drops, hard candies and sore throat lozenges may ease your cough. . Avoid close contacts especially the very young and the elderly . Cover your mouth if you cough or sneeze . Always remember to wash your hands.   GET HELP RIGHT AWAY IF: . You develop worsening fever. . If your symptoms do not improve within 10 days . You develop yellow or green discharge from your nose over 3 days. . You have coughing fits . You develop a severe head ache or visual changes. . You develop shortness of breath, difficulty breathing or start having chest pain . Your symptoms persist after you have completed your treatment plan  MAKE SURE YOU   Understand these instructions.  Will watch your condition.  Will get help right away if you are not doing well or get worse.  Your e-visit answers were reviewed by a board certified advanced clinical practitioner to complete your personal care plan. Depending upon the condition, your plan could have included both over the counter or prescription medications. Please review your pharmacy choice. If there is a problem, you may call our nursing hot line at and have the prescription routed to another pharmacy. Your safety is important to [place]. If you have drug allergies check your prescription carefully.   You can use MyChart to ask questions about today's visit, request a non-urgent call back, or ask for a work or school excuse for 24 hours related to this e-Visit. If it has been greater than 24 hours you will need to follow up with your provider, or enter a new e-Visit to address those concerns. You will get an e-mail in the next two days asking about your experience.  I hope that your e-visit has been valuable and will speed your recovery. Thank you for using e-visits.  Approximately 5 minutes was spent documenting and reviewing patient's chart.

## 2021-05-24 ENCOUNTER — Ambulatory Visit: Payer: Self-pay

## 2021-05-24 ENCOUNTER — Encounter: Payer: Self-pay | Admitting: Nurse Practitioner

## 2021-05-24 ENCOUNTER — Other Ambulatory Visit: Payer: Self-pay

## 2021-05-24 ENCOUNTER — Ambulatory Visit (INDEPENDENT_AMBULATORY_CARE_PROVIDER_SITE_OTHER): Payer: BC Managed Care – PPO | Admitting: Nurse Practitioner

## 2021-05-24 VITALS — BP 88/49 | HR 54 | Temp 97.9°F | Ht 70.0 in | Wt 200.6 lb

## 2021-05-24 DIAGNOSIS — R0602 Shortness of breath: Secondary | ICD-10-CM | POA: Diagnosis not present

## 2021-05-24 NOTE — Telephone Encounter (Signed)
Reason for Disposition  [1] MILD difficulty breathing (e.g., minimal/no SOB at rest, SOB with walking, pulse <100) AND [2] NEW-onset or WORSE than normal  Answer Assessment - Initial Assessment Questions 1. RESPIRATORY STATUS: "Describe your breathing?" (e.g., wheezing, shortness of breath, unable to speak, severe coughing)      Shortness of breath 2. ONSET: "When did this breathing problem begin?"      2 weeks ago 3. PATTERN "Does the difficult breathing come and go, or has it been constant since it started?"      Comes and goes 4. SEVERITY: "How bad is your breathing?" (e.g., mild, moderate, severe)    - MILD: No SOB at rest, mild SOB with walking, speaks normally in sentences, can lie down, no retractions, pulse < 100.    - MODERATE: SOB at rest, SOB with minimal exertion and prefers to sit, cannot lie down flat, speaks in phrases, mild retractions, audible wheezing, pulse 100-120.    - SEVERE: Very SOB at rest, speaks in single words, struggling to breathe, sitting hunched forward, retractions, pulse > 120      Mild 5. RECURRENT SYMPTOM: "Have you had difficulty breathing before?" If Yes, ask: "When was the last time?" and "What happened that time?"      No 6. CARDIAC HISTORY: "Do you have any history of heart disease?" (e.g., heart attack, angina, bypass surgery, angioplasty)      No 7. LUNG HISTORY: "Do you have any history of lung disease?"  (e.g., pulmonary embolus, asthma, emphysema)     No 8. CAUSE: "What do you think is causing the breathing problem?"      Unsure 9. OTHER SYMPTOMS: "Do you have any other symptoms? (e.g., dizziness, runny nose, cough, chest pain, fever)     No 10. O2 SATURATION MONITOR:  "Do you use an oxygen saturation monitor (pulse oximeter) at home?" If Yes, "What is your reading (oxygen level) today?" "What is your usual oxygen saturation reading?" (e.g., 95%)       No 11. PREGNANCY: "Is there any chance you are pregnant?" "When was your last menstrual  period?"       N/a 12. TRAVEL: "Have you traveled out of the country in the last month?" (e.g., travel history, exposures)       No  Protocols used: Breathing Difficulty-A-AH

## 2021-05-24 NOTE — Progress Notes (Signed)
Results discussed with patient during visit today. Referral placed for Cardiology.

## 2021-05-24 NOTE — Progress Notes (Signed)
BP (!) 88/49 (BP Location: Left Arm, Cuff Size: Normal)   Pulse (!) 54   Temp 97.9 F (36.6 C) (Oral)   Ht '5\' 10"'  (1.778 m)   Wt 200 lb 9.6 oz (91 kg)   SpO2 99%   BMI 28.78 kg/m    Subjective:    Patient ID: Austin Miles, male    DOB: 1996/04/04, 25 y.o.   MRN: [id number]  HPI: Austin Miles is a 25 y.o. male  Chief Complaint  Patient presents with   Shortness of Breath    Pt states he has been feeling more short of breath recently. 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    Patient presents to clinic with complaints of SOB and that his chest has been tight.  Patient states throughout the day he feels the SOB.  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  Patient states he works out daily and does not have any symptoms.  Patient is not currently taking any supplements.    Denies HA, CP, dizziness, palpitations, visual changes, and lower extremity swelling.  Relevant past medical, surgical, family and social history reviewed and updated as indicated. Interim medical history since our last visit reviewed. Allergies and medications reviewed and updated.  Review of Systems  Eyes:  Negative for visual disturbance.  Respiratory:  Positive for shortness of breath.   Cardiovascular:  Negative for chest pain and leg swelling.  Neurological:  Negative for light-headedness and headaches.   Per HPI unless specifically indicated above     Objective:    BP (!) 88/49 (BP Location: Left Arm, Cuff Size: Normal)   Pulse (!) 54   Temp 97.9 F (36.6 C) (Oral)   Ht '5\' 10"'  (1.778 m)   Wt 200 lb 9.6 oz (91 kg)   SpO2 99%   BMI 28.78 kg/m   Wt Readings from Last 3 Encounters:  05/24/21 200 lb 9.6 oz (91 kg)  11/09/18 225 lb (102.1 kg)  06/08/18 217 lb 14.4 oz (98.8 kg) (96 %, Z= 1.81)*   * Growth percentiles are based on CDC (Boys, 2-20 Years) data.    Physical Exam Vitals and nursing note reviewed.  Constitutional:      General: He is not in  acute distress.    Appearance: Normal appearance. He is not ill-appearing, toxic-appearing or diaphoretic.  HENT:     Head: Normocephalic.     Right Ear: External ear normal.     Left Ear: External ear normal.     Nose: Nose normal. No congestion or rhinorrhea.     Mouth/Throat:     Mouth: Mucous membranes are moist.  Eyes:     General:        Right eye: No discharge.        Left eye: No discharge.     Extraocular Movements: Extraocular movements intact.     Conjunctiva/sclera: Conjunctivae normal.     Pupils: Pupils are equal, round, and reactive to light.  Cardiovascular:     Rate and Rhythm: Normal rate and regular rhythm.     Heart sounds: No murmur heard. Pulmonary:     Effort: Pulmonary effort is normal. No respiratory distress.     Breath sounds: Normal breath sounds. No wheezing, rhonchi or rales.  Abdominal:     General: Abdomen is flat. Bowel sounds are normal.  Musculoskeletal:     Cervical back: Normal range of motion and neck supple.  Skin:  General: Skin is warm and dry.     Capillary Refill: Capillary refill takes less than 2 seconds.  Neurological:     General: No focal deficit present.     Mental Status: He is alert and oriented to person, place, and time.  Psychiatric:        Mood and Affect: Mood normal.        Behavior: Behavior normal.        Thought Content: Thought content normal.        Judgment: Judgment normal.    Results for orders placed or performed in visit on 07/22/19  Novel Coronavirus, NAA ([facility])   Specimen: Nasopharyngeal(NP) swabs in vial transport medium   NASOPHARYNGE  TESTING  Result Value Ref Range   SARS-CoV-2, NAA Detected (A) Not Detected      Assessment & Plan:   Problem List Items Addressed This Visit       Other   SOB (shortness of breath) - Primary    EKG done in office today. Showed possible bundle branch block and peaked T waves.  Could have been lead placement but will send to Cardiology for further  evaluation. Will order chest xray and labs to rule out other causes of SOB. Discussed with patient that it could be related to the respirator he wears. Recommend changing respirator to see if symptoms improved.       Relevant Orders   EKG 12-Lead (Completed)   Comp Met (CMET)   CBC w/Diff   DG Chest 2 View   Ambulatory referral to Cardiology     Follow up plan: Return if symptoms worsen or fail to improve.

## 2021-05-24 NOTE — Telephone Encounter (Signed)
Pt. Reports he has had shortness of breath x 2 weeks on and off. Today has lasted longer and has chest tightness. No other symptoms. Appointment made for today.

## 2021-05-24 NOTE — Assessment & Plan Note (Signed)
EKG done in office today. Showed possible bundle branch block and peaked T waves.  Could have been lead placement but will send to Cardiology for further evaluation. Will order chest xray and labs to rule out other causes of SOB. Discussed with patient that it could be related to the respirator he wears. Recommend changing respirator to see if symptoms improved.

## 2021-05-26 LAB — CBC WITH DIFFERENTIAL/PLATELET
Basophils Absolute: 0 10*3/uL (ref 0.0–0.2)
Basos: 1 %
EOS (ABSOLUTE): 0.1 10*3/uL (ref 0.0–0.4)
Eos: 2 %
Hematocrit: 46.7 % (ref 37.5–51.0)
Hemoglobin: 15.7 g/dL (ref 13.0–17.7)
Immature Grans (Abs): 0 10*3/uL (ref 0.0–0.1)
Immature Granulocytes: 0 %
Lymphocytes Absolute: 2.3 10*3/uL (ref 0.7–3.1)
Lymphs: 40 %
MCH: 30.8 pg (ref 26.6–33.0)
MCHC: 33.6 g/dL (ref 31.5–35.7)
MCV: 92 fL (ref 79–97)
Monocytes Absolute: 0.5 10*3/uL (ref 0.1–0.9)
Monocytes: 8 %
Neutrophils Absolute: 2.8 10*3/uL (ref 1.4–7.0)
Neutrophils: 49 %
Platelets: 167 10*3/uL (ref 150–450)
RBC: 5.1 x10E6/uL (ref 4.14–5.80)
RDW: 12.5 % (ref 11.6–15.4)
WBC: 5.7 10*3/uL (ref 3.4–10.8)

## 2021-05-26 LAB — COMPREHENSIVE METABOLIC PANEL
ALT: 21 IU/L (ref 0–44)
AST: 19 IU/L (ref 0–40)
Albumin/Globulin Ratio: 2.6 — ABNORMAL HIGH (ref 1.2–2.2)
Albumin: 4.9 g/dL (ref 4.1–5.2)
Alkaline Phosphatase: 78 IU/L (ref 44–121)
BUN/Creatinine Ratio: 24 — ABNORMAL HIGH (ref 9–20)
BUN: 26 mg/dL — ABNORMAL HIGH (ref 6–20)
Bilirubin Total: 0.4 mg/dL (ref 0.0–1.2)
CO2: 21 mmol/L (ref 20–29)
Calcium: 9.9 mg/dL (ref 8.7–10.2)
Chloride: 102 mmol/L (ref 96–106)
Creatinine, Ser: 1.1 mg/dL (ref 0.76–1.27)
Globulin, Total: 1.9 g/dL (ref 1.5–4.5)
Glucose: 86 mg/dL (ref 70–99)
Potassium: 4.5 mmol/L (ref 3.5–5.2)
Sodium: 143 mmol/L (ref 134–144)
Total Protein: 6.8 g/dL (ref 6.0–8.5)
eGFR: 97 mL/min/{1.73_m2} (ref >59)

## 2021-05-26 NOTE — Progress Notes (Signed)
Hi Versie.  Your blood work looks good.  No reason to explain your symptoms.  Let's see what the Cardiologist has to say.  Follow up if your symptoms do not improve.

## 2021-06-23 NOTE — Progress Notes (Signed)
Primary Care Provider: Jon Billings, NP [facility] Cardiologist: None Electrophysiologist: None  Clinic Note: Chief Complaint  Patient presents with   Shortness of Breath    Associated with tightness and popping catching his breath; not associated with exertion.   ===================================  ASSESSMENT/PLAN   Problem List Items Addressed This Visit     SOB (shortness of breath) - Primary    EKG is normal.  He had some mild repolarization changes on his EKG at PCPs office bundle branch block  Shortness of breath is at rest, not with exertion.  Does not sound cardiac in nature.  He is way too young to have coronary disease, especially since he does not have any exertional symptoms.  Plan: Check 2D echocardiogram.  If relatively normal, my plan will be to refer him to pulmonary medicine.      Relevant Orders   ECHOCARDIOGRAM COMPLETE   EKG 12-Lead   Nonspecific abnormal electrocardiogram (ECG) (EKG)    Pretty normal EKG on my check.  I do not visibly see any abnormalities besides some mild polarization changes which is not abnormal for a young healthy gentleman.  He has sinus arrhythmia which also is not concerning in a young man.      Relevant Orders   ECHOCARDIOGRAM COMPLETE   EKG 12-Lead   Chest tightness    Chest tightness noticed only with his shortness of breath spells.  I do think he may benefit from albuterol use.  We are checking 2D echocardiogram just to make sure there is no structural abnormality or other concerning features, however I suspect it will be normal.  At that visit is no exertional symptoms.  He is in good shape otherwise.  Not likely to be cardiac in nature but I do not think a stress test is warranted.      Relevant Orders   ECHOCARDIOGRAM COMPLETE   EKG 12-Lead    ===================================  HPI:    Austin Miles is a 25 y.o. male who is being seen today for the evaluation  of SHORTNESS OF BREATH, CHEST TIGHTNESS and ABNORMAL EKG at the request of Jon Billings, NP.  Omkar T Breslin was seen on May 24, 2021 for evaluation of shortness of breath and chest tightness.  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  Interestingly, he notes that he works out routinely and does not have the symptoms while working out.   -> Denied any chest pain or dyspnea, palpitations, edema. Chest x-ray ordered, not yet done.   Recommended changing respirator mask. Referred to cardiology because of potential abnormal findings on EKG  Recent Hospitalizations: none  Reviewed  CV studies:    The following studies were reviewed today: (if available, images/films reviewed: From Epic Chart or Care Everywhere) None:   Interval History:   Austin Miles is a healthy young gentleman referred because of episodic dyspnea.  He tells me that at baseline he is very active, he works out at [employer] has had no issues.  He says that he has not really been doing as much Cardio exercise of late, he has been doing more heavy lifting.  Trying to put on weight by eating more calories and [employer].  His major complaint is that he has episodes where he feels very short of breath where it is very difficult to catch his breath and he has tightness in his  chest this is not associate with exertion-does not notice it at all while he is working out at [employer].  He usually notes it at work shortly after using his respirator.  He is concerned though because there are that he lives his job but do not have the same issue with respirators.  He has never diagnosed with asthma.  However there was a question of exercise-induced asthma somewhere.  His maternal grandfather had early CAD, therefore it he and his family were concerned about a possible cardiac etiology.   CV Review of Symptoms (Summary) Cardiovascular  ROS: positive for - shortness of breath and this is associated with chest tightness.  Not with exertion.  It is associate with some lightheadedness negative for - dyspnea on exertion, edema, irregular heartbeat, orthopnea, palpitations, paroxysmal nocturnal dyspnea, rapid heart rate, or syncope or near syncope TIA/amaurosis fugax claudication  REVIEWED OF SYSTEMS   Review of Systems  Constitutional:  Negative for malaise/fatigue and weight loss.  HENT:  Negative for nosebleeds.   Respiratory:  Positive for shortness of breath (Associated with a tightness in his chest-difficulty breathing.  Not associate with activity.  Usually while at work.).   Cardiovascular:  Negative for leg swelling.  Gastrointestinal:  Negative for blood in stool and melena.  Genitourinary:  Negative for hematuria.  Musculoskeletal: Negative.   Neurological: Negative.   Psychiatric/Behavioral: Negative.     I have reviewed and (if needed) personally updated the patient's problem list, medications, allergies, past medical and surgical history, social and family history.   PAST MEDICAL HISTORY   Past Medical History:  Diagnosis Date   Headache     PAST SURGICAL HISTORY   Past Surgical History:  Procedure Laterality Date   CIRCUMCISION  1999   MYRINGOTOMY WITH TUBE PLACEMENT Bilateral    90 Months of age    Immunization History  Administered Date(s) Administered   DTaP 10/05/1998, 11/27/1998, 01/25/1999, 12/10/1999, 07/31/2002   Hepatitis A 10/02/2006, 11/23/2007   Hepatitis B 01/25/1999, 04/23/1999, 02/17/2000   HiB (PRP-OMP) 10/05/1998, 11/27/1998, 12/10/1999   IPV 10/05/1998, 11/27/1998, 02/17/2000, 07/31/2002   MMR 07/26/1999, 07/31/2002   Meningococcal Conjugate 10/31/2014   Pneumococcal Conjugate-13 07/26/1999, 12/10/1999   Td 09/23/2009, 04/29/2016   Tdap 09/23/2009, 04/29/2016   Varicella 07/26/1999, 10/02/2006    MEDICATIONS/ALLERGIES   Current Meds  Medication Sig   albuterol  (PROVENTIL HFA;VENTOLIN HFA) 108 (90 Base) MCG/ACT inhaler Inhale 2 puffs into the lungs every 6 (six) hours as needed for wheezing or shortness of breath.    No Known Allergies  SOCIAL HISTORY/FAMILY HISTORY   Reviewed in Epic:   Social History   Tobacco Use   Smoking status: Never   Smokeless tobacco: Former  Scientific laboratory technician Use: Former   Quit date: 08/27/2019  Substance Use Topics   Alcohol use: Yes    Comment: just some times per pt   Drug use: No   Social History   Social History Narrative   He works out routinely.  Has been doing more weightlifting then cardio exercises in the last few months.  He does not use any exercise supplements.  He has been trying to gain weight however, and has been eating more calories and whole foods.      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   While painting, he wears a respirator with filters.  He says he feels great places filters routinely-usually ahead of schedule.   Family History  Problem Relation Age  of Onset   Multiple sclerosis Mother    Heart attack Paternal Grandfather 67       Second MI at 72   Coronary artery disease Paternal Grandfather 55       CABG age 87    OBJCTIVE -PE, EKG, labs   Wt Readings from Last 3 Encounters:  06/24/21 207 lb (93.9 kg)  05/24/21 200 lb 9.6 oz (91 kg)  11/09/18 225 lb (102.1 kg)    Physical Exam: BP 128/62   Pulse 71   Ht '5\' 10"'  (1.778 m)   Wt 207 lb (93.9 kg)   SpO2 99%   BMI 29.70 kg/m  Physical Exam Vitals reviewed.  Constitutional:      General: He is not in acute distress.    Appearance: Normal appearance. He is normal weight. He is not ill-appearing or toxic-appearing.  HENT:     Head: Normocephalic and atraumatic.  Neck:     Vascular: No carotid bruit.  Cardiovascular:     Rate and Rhythm: Normal rate and regular rhythm.     Pulses: Normal pulses.     Heart sounds: Normal heart sounds. No murmur heard.   No friction rub.  No gallop.  Pulmonary:     Effort: Pulmonary effort is normal. No respiratory distress.     Breath sounds: Normal breath sounds. No decreased breath sounds, wheezing or rhonchi.  Chest:     Chest wall: No mass or tenderness.  Abdominal:     General: Abdomen is flat. Bowel sounds are normal. There is no distension.     Palpations: Abdomen is soft. There is no mass.     Tenderness: There is no abdominal tenderness.     Comments: No HSM  Musculoskeletal:        General: No swelling. Normal range of motion.     Cervical back: Normal range of motion and neck supple.  Skin:    General: Skin is warm and dry.  Neurological:     General: No focal deficit present.     Mental Status: He is alert and oriented to person, place, and time.     Gait: Gait normal.  Psychiatric:        Mood and Affect: Mood normal.        Behavior: Behavior normal.        Thought Content: Thought content normal.        Judgment: Judgment normal.     Adult ECG Report PCP office: Rate: 47;  Rhythm: sinus bradycardia and otherwise normal axis, intervals and durations.  Normal conduction.  Subtle J-point elevation/repolarization changes not uncommon for 25 year old man.  T waves are prominent, but not abnormal. ;   Narrative Interpretation: Normal EKG besides bradycardia.   Rate: 71;  Rhythm: normal sinus rhythm, sinus arrhythmia, and normal axis, intervals and durations. ;   Narrative Interpretation: Stable   Recent Labs: Reviewed Lab Results  Component Value Date   CHOL 162 06/08/2018   HDL 39 (L) 06/08/2018   LDLCALC 67 06/08/2018   TRIG 279 (H) 06/08/2018   Lab Results  Component Value Date   CREATININE 1.10 05/24/2021   BUN 26 (H) 05/24/2021   NA 143 05/24/2021   K 4.5 05/24/2021   CL 102 05/24/2021   CO2 21 05/24/2021   CBC Latest Ref Rng & Units 05/24/2021 06/08/2018 06/24/2014  WBC 3.4 - 10.8 x10E3/uL 5.7 5.7 8.6  Hemoglobin 13.0 - 17.7 g/dL 15.7 15.5 15.9(H)  Hematocrit 37.5 - 51.0 % 46.7  45.0 45.0(H)  Platelets 150 - 450 x10E3/uL 167 185 173    No results found for: HGBA1C No results found for: TSH  ==================================================  COVID-19 Education: The signs and symptoms of COVID-19 were discussed with the patient and how to seek care for testing (follow up with PCP or arrange E-visit).    I spent a total of 30 minutes with the patient spent in direct patient consultation.  Additional time spent with chart review  / charting (studies, outside notes, etc): 12 min (Pre charting - 8 min) Total Time: 50  min  Current medicines are reviewed at length with the patient today.  (+/- concerns) none  This visit occurred during the SARS-CoV-2 public health emergency.  Safety protocols were in place, including screening questions prior to the visit, additional usage of staff PPE, and extensive cleaning of exam room while observing appropriate contact time as indicated for disinfecting solutions.  Notice: This dictation was prepared with Dragon dictation along with smart phrase technology. Any transcriptional errors that result from this process are unintentional and may not be corrected upon review.   Studies Ordered:  Orders Placed This Encounter  Procedures   EKG 12-Lead   ECHOCARDIOGRAM COMPLETE    Patient Instructions / Medication Changes & Studies & Tests Ordered   Patient Instructions  Medication Instructions:  Your physician recommends that you continue on your current medications as directed. Please refer to the Current Medication list given to you today.  *If you need a refill on your cardiac medications before your next appointment, please call your pharmacy*   Lab Work: None If you have labs (blood work) drawn today and your tests are completely normal, you will receive your results only by: [place] (if you have MyChart) OR A paper copy in the mail If you have any lab test that is abnormal or we need to change your treatment, we  will call you to review the results.   Testing/Procedures: Your physician has requested that you have an echocardiogram. Echocardiography is a painless test that uses sound waves to create images of your heart. It provides your doctor with information about the size and shape of your heart and how well your heart's chambers and valves are working. This procedure takes approximately one hour. There are no restrictions for this procedure.    Follow-Up: At [facility], you and your health needs are our priority.  As part of our continuing mission to provide you with exceptional heart care, we have created designated Provider Care Teams.  These Care Teams include your primary Cardiologist (physician) and Advanced Practice Providers (APPs -  Physician Assistants and Nurse Practitioners) who all work together to provide you with the care you need, when you need it.   Your next appointment:   Follow up will be based on your ECHO results. If normal, we will refer you to pulmonology, if abnormal we will plan to see you back.  The format for your next appointment:   In Person  Provider:   Leonie Man, MD     Glenetta Hew, M.D., M.S. Interventional Cardiologist   Pager # [phone] Phone # [phone] [address] [place], [place] [address]   Thank you for choosing Heartcare at [facility]!!

## 2021-06-24 ENCOUNTER — Other Ambulatory Visit: Payer: Self-pay

## 2021-06-24 ENCOUNTER — Ambulatory Visit (INDEPENDENT_AMBULATORY_CARE_PROVIDER_SITE_OTHER): Payer: BC Managed Care – PPO | Admitting: Cardiology

## 2021-06-24 ENCOUNTER — Encounter: Payer: Self-pay | Admitting: Cardiology

## 2021-06-24 VITALS — BP 128/62 | HR 71 | Ht 70.0 in | Wt 207.0 lb

## 2021-06-24 DIAGNOSIS — R0602 Shortness of breath: Secondary | ICD-10-CM

## 2021-06-24 DIAGNOSIS — R9431 Abnormal electrocardiogram [ECG] [EKG]: Secondary | ICD-10-CM

## 2021-06-24 DIAGNOSIS — R0789 Other chest pain: Secondary | ICD-10-CM | POA: Diagnosis not present

## 2021-06-24 NOTE — Patient Instructions (Signed)
Medication Instructions:  Your physician recommends that you continue on your current medications as directed. Please refer to the Current Medication list given to you today.  *If you need a refill on your cardiac medications before your next appointment, please call your pharmacy*   Lab Work: None If you have labs (blood work) drawn today and your tests are completely normal, you will receive your results only by: MyChart Message (if you have MyChart) OR A paper copy in the mail If you have any lab test that is abnormal or we need to change your treatment, we will call you to review the results.   Testing/Procedures: Your physician has requested that you have an echocardiogram. Echocardiography is a painless test that uses sound waves to create images of your heart. It provides your doctor with information about the size and shape of your heart and how well your heart's chambers and valves are working. This procedure takes approximately one hour. There are no restrictions for this procedure.    Follow-Up: At [facility], you and your health needs are our priority.  As part of our continuing mission to provide you with exceptional heart care, we have created designated Provider Care Teams.  These Care Teams include your primary Cardiologist (physician) and Advanced Practice Providers (APPs -  Physician Assistants and Nurse Practitioners) who all work together to provide you with the care you need, when you need it.   Your next appointment:   Follow up will be based on your ECHO results. If normal, we will refer you to pulmonology, if abnormal we will plan to see you back.  The format for your next appointment:   In Person  Provider:   Marykay Lex, MD

## 2021-06-27 ENCOUNTER — Encounter: Payer: Self-pay | Admitting: Cardiology

## 2021-06-27 NOTE — Assessment & Plan Note (Signed)
Chest tightness noticed only with his shortness of breath spells.  I do think he may benefit from albuterol use.  We are checking 2D echocardiogram just to make sure there is no structural abnormality or other concerning features, however I suspect it will be normal.  At that visit is no exertional symptoms.  He is in good shape otherwise.  Not likely to be cardiac in nature but I do not think a stress test is warranted.

## 2021-06-27 NOTE — Assessment & Plan Note (Signed)
EKG is normal.  He had some mild repolarization changes on his EKG at PCPs office bundle branch block  Shortness of breath is at rest, not with exertion.  Does not sound cardiac in nature.  He is way too young to have coronary disease, especially since he does not have any exertional symptoms.  Plan: Check 2D echocardiogram.  If relatively normal, my plan will be to refer him to pulmonary medicine.

## 2021-06-27 NOTE — Assessment & Plan Note (Signed)
Pretty normal EKG on my check.  I do not visibly see any abnormalities besides some mild polarization changes which is not abnormal for a young healthy gentleman.  He has sinus arrhythmia which also is not concerning in a young man.

## 2021-07-20 ENCOUNTER — Other Ambulatory Visit: Payer: BC Managed Care – PPO

## 2022-12-15 ENCOUNTER — Encounter: Payer: Self-pay | Admitting: Nurse Practitioner

## 2022-12-15 ENCOUNTER — Ambulatory Visit (INDEPENDENT_AMBULATORY_CARE_PROVIDER_SITE_OTHER): Payer: Managed Care, Other (non HMO) | Admitting: Nurse Practitioner

## 2022-12-15 VITALS — BP 106/65 | HR 56 | Temp 98.4°F | Wt 215.6 lb

## 2022-12-15 DIAGNOSIS — D1779 Benign lipomatous neoplasm of other sites: Secondary | ICD-10-CM

## 2022-12-15 NOTE — Progress Notes (Signed)
BP 106/65   Pulse (!) 56   Temp 98.4 F (36.9 C) (Oral)   Wt 215 lb 9.6 oz (97.8 kg)   SpO2 98%   BMI 30.94 kg/m    Subjective:    Patient ID: Austin Miles, male    DOB: Oct 11, 1995, 27 y.o.   MRN: [id number]  HPI: Austin Miles is a 27 y.o. male  Chief Complaint  Patient presents with   Mass    Pt states he has a knot on the upper part of is back, left side. States it has not drained and does not really hurt.    Patient presents to clinic with complaints of a lump on his back.  Patient states it has been there for about a month and a half.  Denies any pain or itchy.  Has gotten larger.    Relevant past medical, surgical, family and social history reviewed and updated as indicated. Interim medical history since our last visit reviewed. Allergies and medications reviewed and updated.  Review of Systems  Skin:        Lump on back    Per HPI unless specifically indicated above     Objective:    BP 106/65   Pulse (!) 56   Temp 98.4 F (36.9 C) (Oral)   Wt 215 lb 9.6 oz (97.8 kg)   SpO2 98%   BMI 30.94 kg/m   Wt Readings from Last 3 Encounters:  12/15/22 215 lb 9.6 oz (97.8 kg)  06/24/21 207 lb (93.9 kg)  05/24/21 200 lb 9.6 oz (91 kg)    Physical Exam Vitals and nursing note reviewed.  Constitutional:      General: He is not in acute distress.    Appearance: Normal appearance. He is not ill-appearing, toxic-appearing or diaphoretic.  HENT:     Head: Normocephalic.     Right Ear: External ear normal.     Left Ear: External ear normal.     Nose: Nose normal. No congestion or rhinorrhea.     Mouth/Throat:     Mouth: Mucous membranes are moist.  Eyes:     General:        Right eye: No discharge.        Left eye: No discharge.     Extraocular Movements: Extraocular movements intact.     Conjunctiva/sclera: Conjunctivae normal.     Pupils: Pupils are equal, round, and reactive to light.  Cardiovascular:     Rate and Rhythm: Normal rate and  regular rhythm.     Heart sounds: No murmur heard. Pulmonary:     Effort: Pulmonary effort is normal. No respiratory distress.     Breath sounds: Normal breath sounds. No wheezing, rhonchi or rales.  Abdominal:     General: Abdomen is flat. Bowel sounds are normal.  Musculoskeletal:     Cervical back: Normal range of motion and neck supple.  Skin:    General: Skin is warm and dry.     Capillary Refill: Capillary refill takes less than 2 seconds.       Neurological:     General: No focal deficit present.     Mental Status: He is alert and oriented to person, place, and time.  Psychiatric:        Mood and Affect: Mood normal.        Behavior: Behavior normal.        Thought Content: Thought content normal.        Judgment: Judgment normal.  Results for orders placed or performed in visit on 05/24/21  Comp Met (CMET)  Result Value Ref Range   Glucose 86 70 - 99 mg/dL   BUN 26 (H) 6 - 20 mg/dL   Creatinine, Ser 4.09 0.76 - 1.27 mg/dL   eGFR 97 >81 XB/JYN/8.29   BUN/Creatinine Ratio 24 (H) 9 - 20   Sodium 143 134 - 144 mmol/L   Potassium 4.5 3.5 - 5.2 mmol/L   Chloride 102 96 - 106 mmol/L   CO2 21 20 - 29 mmol/L   Calcium 9.9 8.7 - 10.2 mg/dL   Total Protein 6.8 6.0 - 8.5 g/dL   Albumin 4.9 4.1 - 5.2 g/dL   Globulin, Total 1.9 1.5 - 4.5 g/dL   Albumin/Globulin Ratio 2.6 (H) 1.2 - 2.2   Bilirubin Total 0.4 0.0 - 1.2 mg/dL   Alkaline Phosphatase 78 44 - 121 IU/L   AST 19 0 - 40 IU/L   ALT 21 0 - 44 IU/L  CBC w/Diff  Result Value Ref Range   WBC 5.7 3.4 - 10.8 x10E3/uL   RBC 5.10 4.14 - 5.80 x10E6/uL   Hemoglobin 15.7 13.0 - 17.7 g/dL   Hematocrit 56.2 13.0 - 51.0 %   MCV 92 79 - 97 fL   MCH 30.8 26.6 - 33.0 pg   MCHC 33.6 31.5 - 35.7 g/dL   RDW 86.5 78.4 - 69.6 %   Platelets 167 150 - 450 x10E3/uL   Neutrophils 49 Not Estab. %   Lymphs 40 Not Estab. %   Monocytes 8 Not Estab. %   Eos 2 Not Estab. %   Basos 1 Not Estab. %   Neutrophils Absolute 2.8 1.4 - 7.0  x10E3/uL   Lymphocytes Absolute 2.3 0.7 - 3.1 x10E3/uL   Monocytes Absolute 0.5 0.1 - 0.9 x10E3/uL   EOS (ABSOLUTE) 0.1 0.0 - 0.4 x10E3/uL   Basophils Absolute 0.0 0.0 - 0.2 x10E3/uL   Immature Granulocytes 0 Not Estab. %   Immature Grans (Abs) 0.0 0.0 - 0.1 x10E3/uL      Assessment & Plan:   Problem List Items Addressed This Visit   None Visit Diagnoses     Lipoma of other specified sites    -  Primary   Upper left area of back. Discussed Lipoma's with patient at visit today.  He elects to hold off on further evaluation and treatment at this time.        Follow up plan: Return if symptoms worsen or fail to improve.

## 2023-01-24 ENCOUNTER — Encounter: Payer: Self-pay | Admitting: Nurse Practitioner

## 2023-01-24 ENCOUNTER — Ambulatory Visit (INDEPENDENT_AMBULATORY_CARE_PROVIDER_SITE_OTHER): Payer: Managed Care, Other (non HMO) | Admitting: Nurse Practitioner

## 2023-01-24 VITALS — BP 131/69 | HR 85 | Temp 98.9°F | Wt 222.2 lb

## 2023-01-24 DIAGNOSIS — T148XXA Other injury of unspecified body region, initial encounter: Secondary | ICD-10-CM

## 2023-01-24 MED ORDER — CYCLOBENZAPRINE HCL 10 MG PO TABS: 10.0000 mg | ORAL_TABLET | Freq: Three times a day (TID) | ORAL | 0 refills | Status: AC | PRN

## 2023-01-24 MED ORDER — METHYLPREDNISOLONE 4 MG PO TBPK: ORAL_TABLET | ORAL | 0 refills | Status: AC
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
## 2023-01-30 ENCOUNTER — Telehealth: Payer: Self-pay | Admitting: Nurse Practitioner

## 2023-01-30 NOTE — Telephone Encounter (Signed)
Patient came into office to request form completion to return to work for provider Larae Grooms , NP. Informed patient to allow 48-72 hours for completion. Patient request phone call when completed @ [phone]. Put forms in providers box.

## 2023-01-30 NOTE — Telephone Encounter (Signed)
Form completed and signed by provider.   Notified patient that form was ready to be picked up.

## 2023-02-24 ENCOUNTER — Encounter: Payer: Managed Care, Other (non HMO) | Admitting: Physician Assistant

## 2023-07-04 NOTE — Progress Notes (Deleted)
There were no vitals taken for this visit.   Subjective:    Patient ID: Austin Miles, male    DOB: [birth year], 27 y.o.   MRN: [id number]  HPI: Austin Miles is a 27 y.o. male  No chief complaint on file.  ARM PAIN Duration: {Blank single:19197::"chronic","days","weeks","months"} Location: {Blank single:19197::"left","right","bilateral","wrist","forearm","elbow","upper arm","biceps","triceps","shoulder"} Mechanism of injury: {Blank single:19197::"trauma","unknown"} Onset: {Blank single:19197::"sudden","gradual"} Severity: {Blank single:19197::"mild","moderate","severe","1/10","2/10","3/10","4/10","5/10","6/10","7/10","8/10","9/10","10/10"}  Quality:  {Blank multiple:19196::"sharp","dull","aching","burning","cramping","ill-defined","itchy","pressure-like","pulling","shooting","sore","stabbing","tender","tearing","throbbing"} Frequency: {Blank single:19197::"constant","intermittent","occasional","rare","every few minutes","a few times a hour","a few times a day","a few times a week","a few times a month","a few times a year"} Radiation: {Blank single:19197::"yes","no"} Aggravating factors: {Blank multiple:19196::"weight bearing","walking","running","stairs","bending","movement","prolonged sitting"}  Alleviating factors: {Blank multiple:19196::"nothing","ice","physical therapy","HEP","APAP","NSAIDs","brace","crutches","rest"}  Status: {Blank multiple:19196::"better","worse","stable","fluctuating"} Treatments attempted: {Blank multiple:19196::"none","rest","ice","heat","APAP","ibuprofen","aleve","physical therapy","HEP"}  Relief with NSAIDs?:  {Blank single:19197::"No NSAIDs Taken","no","mild","moderate","significant"} Swelling: {Blank single:19197::"yes","no"} Redness: {Blank single:19197::"yes","no"}  Warmth: {Blank single:19197::"yes","no"} Trauma: {Blank single:19197::"yes","no"} Chest pain: {Blank single:19197::"yes","no"}  Shortness of breath: {Blank  single:19197::"yes","no"}  Fever: {Blank single:19197::"yes","no"} Decreased sensation: {Blank single:19197::"yes","no"} Paresthesias: {Blank single:19197::"yes","no"} Weakness: {Blank single:19197::"yes","no"}  Relevant past medical, surgical, family and social history reviewed and updated as indicated. Interim medical history since our last visit reviewed. Allergies and medications reviewed and updated.  Review of Systems  Per HPI unless specifically indicated above     Objective:    There were no vitals taken for this visit.  Wt Readings from Last 3 Encounters:  01/24/23 222 lb 3.2 oz (100.8 kg)  12/15/22 215 lb 9.6 oz (97.8 kg)  06/24/21 207 lb (93.9 kg)    Physical Exam  Results for orders placed or performed in visit on 05/24/21  Comp Met (CMET)  Result Value Ref Range   Glucose 86 70 - 99 mg/dL   BUN 26 (H) 6 - 20 mg/dL   Creatinine, Ser 4.09 0.76 - 1.27 mg/dL   eGFR 97 >81 XB/JYN/8.29   BUN/Creatinine Ratio 24 (H) 9 - 20   Sodium 143 134 - 144 mmol/L   Potassium 4.5 3.5 - 5.2 mmol/L   Chloride 102 96 - 106 mmol/L   CO2 21 20 - 29 mmol/L   Calcium 9.9 8.7 - 10.2 mg/dL   Total Protein 6.8 6.0 - 8.5 g/dL   Albumin 4.9 4.1 - 5.2 g/dL   Globulin, Total 1.9 1.5 - 4.5 g/dL   Albumin/Globulin Ratio 2.6 (H) 1.2 - 2.2   Bilirubin Total 0.4 0.0 - 1.2 mg/dL   Alkaline Phosphatase 78 44 - 121 IU/L   AST 19 0 - 40 IU/L   ALT 21 0 - 44 IU/L  CBC w/Diff  Result Value Ref Range   WBC 5.7 3.4 - 10.8 x10E3/uL   RBC 5.10 4.14 - 5.80 x10E6/uL   Hemoglobin 15.7 13.0 - 17.7 g/dL   Hematocrit 56.2 13.0 - 51.0 %   MCV 92 79 - 97 fL   MCH 30.8 26.6 - 33.0 pg   MCHC 33.6 31.5 - 35.7 g/dL   RDW 86.5 78.4 - 69.6 %   Platelets 167 150 - 450 x10E3/uL   Neutrophils 49 Not Estab. %   Lymphs 40 Not Estab. %   Monocytes 8 Not Estab. %   Eos 2 Not Estab. %   Basos 1 Not Estab. %   Neutrophils Absolute 2.8 1.4 - 7.0 x10E3/uL   Lymphocytes Absolute 2.3 0.7 - 3.1 x10E3/uL   Monocytes  Absolute 0.5 0.1 - 0.9 x10E3/uL   EOS (ABSOLUTE) 0.1 0.0 - 0.4 x10E3/uL   Basophils Absolute 0.0 0.0 - 0.2 x10E3/uL   Immature Granulocytes 0 Not Estab. %  Immature Grans (Abs) 0.0 0.0 - 0.1 x10E3/uL      Assessment & Plan:   Problem List Items Addressed This Visit   None    Follow up plan: No follow-ups on file.

## 2023-07-05 ENCOUNTER — Ambulatory Visit: Payer: Managed Care, Other (non HMO) | Admitting: Nurse Practitioner
